# Patient Record
Sex: Female | Born: 1950 | Race: White | Hispanic: No | Smoking: Never smoker
Health system: Southern US, Community
[De-identification: ages and names within clinical notes are randomized; demographics above are authoritative.]

## PROBLEM LIST (undated history)

## (undated) DIAGNOSIS — K5140 Inflammatory polyps of colon without complications: Secondary | ICD-10-CM

## (undated) DIAGNOSIS — G43909 Migraine, unspecified, not intractable, without status migrainosus: Secondary | ICD-10-CM

## (undated) DIAGNOSIS — E785 Hyperlipidemia, unspecified: Secondary | ICD-10-CM

## (undated) DIAGNOSIS — N390 Urinary tract infection, site not specified: Secondary | ICD-10-CM

## (undated) DIAGNOSIS — I639 Cerebral infarction, unspecified: Secondary | ICD-10-CM

## (undated) DIAGNOSIS — K219 Gastro-esophageal reflux disease without esophagitis: Secondary | ICD-10-CM

## (undated) DIAGNOSIS — M1990 Unspecified osteoarthritis, unspecified site: Secondary | ICD-10-CM

## (undated) DIAGNOSIS — J45909 Unspecified asthma, uncomplicated: Secondary | ICD-10-CM

## (undated) DIAGNOSIS — T7840XA Allergy, unspecified, initial encounter: Secondary | ICD-10-CM

## (undated) HISTORY — PX: BILATERAL OOPHORECTOMY: SHX1221

## (undated) HISTORY — DX: Unspecified asthma, uncomplicated: J45.909

## (undated) HISTORY — DX: Hyperlipidemia, unspecified: E78.5

## (undated) HISTORY — DX: Allergy, unspecified, initial encounter: T78.40XA

## (undated) HISTORY — DX: Urinary tract infection, site not specified: N39.0

## (undated) HISTORY — DX: Cerebral infarction, unspecified: I63.9

## (undated) HISTORY — PX: ABDOMINAL HYSTERECTOMY: SHX81

## (undated) HISTORY — DX: Migraine, unspecified, not intractable, without status migrainosus: G43.909

## (undated) HISTORY — DX: Inflammatory polyps of colon without complications: K51.40

## (undated) HISTORY — DX: Unspecified osteoarthritis, unspecified site: M19.90

## (undated) HISTORY — DX: Gastro-esophageal reflux disease without esophagitis: K21.9

---

## 1990-03-20 DIAGNOSIS — I639 Cerebral infarction, unspecified: Secondary | ICD-10-CM

## 1990-03-20 HISTORY — DX: Cerebral infarction, unspecified: I63.9

## 1994-03-20 HISTORY — PX: ABDOMINAL HYSTERECTOMY: SHX81

## 2007-04-22 ENCOUNTER — Ambulatory Visit: Payer: Self-pay | Admitting: Unknown Physician Specialty

## 2007-05-16 LAB — HM COLONOSCOPY

## 2007-08-19 ENCOUNTER — Ambulatory Visit: Payer: Self-pay | Admitting: Unknown Physician Specialty

## 2008-04-19 LAB — HM PAP SMEAR

## 2011-01-17 LAB — HM DEXA SCAN

## 2011-06-18 LAB — HM MAMMOGRAPHY: HM Mammogram: NORMAL

## 2012-04-19 ENCOUNTER — Ambulatory Visit (INDEPENDENT_AMBULATORY_CARE_PROVIDER_SITE_OTHER): Payer: No Typology Code available for payment source | Admitting: Internal Medicine

## 2012-04-19 ENCOUNTER — Encounter: Payer: Self-pay | Admitting: Internal Medicine

## 2012-04-19 VITALS — BP 110/80 | HR 78 | Temp 98.1°F | Resp 16 | Ht 66.0 in | Wt 230.0 lb

## 2012-04-19 DIAGNOSIS — E669 Obesity, unspecified: Secondary | ICD-10-CM

## 2012-04-19 DIAGNOSIS — K219 Gastro-esophageal reflux disease without esophagitis: Secondary | ICD-10-CM

## 2012-04-19 DIAGNOSIS — K625 Hemorrhage of anus and rectum: Secondary | ICD-10-CM

## 2012-04-19 DIAGNOSIS — M1710 Unilateral primary osteoarthritis, unspecified knee: Secondary | ICD-10-CM

## 2012-04-19 DIAGNOSIS — G4733 Obstructive sleep apnea (adult) (pediatric): Secondary | ICD-10-CM

## 2012-04-19 DIAGNOSIS — D126 Benign neoplasm of colon, unspecified: Secondary | ICD-10-CM

## 2012-04-19 NOTE — Progress Notes (Signed)
Patient ID: Donna Richards, female   DOB: 1950/11/09, 62 y.o.   MRN: [id number]   Patient Active Problem List  Diagnosis  . Obesity (BMI 30-39.9)  . GERD (gastroesophageal reflux disease)  . OSA (obstructive sleep apnea)  . DJD (degenerative joint disease) of knee    Subjective:  CC:   Chief Complaint  Patient presents with  . Establish Care    HPI:   Donna Richards is a 62 y.o. female who presents as a new patient to establish primary care with several ongoing medical issues:   GERD:  History of esophagitis ,  hiatal hernia and environmental allergies .  Takes PPI daily (Protonix) since 1996. Prior nexium trial ,protonix is less $$$.  .  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  .no daily use of NSAIDs or tylenol. But had esophageal swelling in the past with unclear etiology so she avoids NSAIDs. Marland Kitchen   History of CVA:  Had a cerebellar CVA at age 68.  Did not seek medical attention until 10 days later.  Presented with sudden onset of imbalance and vertigo.   Workup included MRI, LP ,ECHO and carotid dopplers.  [place] Silos. Was treated with baby aspirin and Inderal 80 mg that lowered her BP  too much and gave her slurred speech, was taken to [facility] and repeated MRI which confirmed cerebellar CVA ,  Medication was stopped .   Obesity.  She started gaining weight during her 30's secondary to OCPS for severe premenstrual dysphoria which included recurrent nausea e mesis and bleeding,  Had a hysterectomy  Wt loss down to 140 pre treatment of esophagitis,  Then gained it all back . Has tried Weight Watchers 3 times,  Did fairly well .   Mild sleep apnea,diagnosed by  Meredeth Ide with a home pulse oximetry test ; she prefers to defer CPAP until she loses weight.   Health screening: she is overdue for colonoscopy.  Was due in 2010 for  history of adenomatous polyps.  Has rectal bleeding frequently despite using Cotton wipes.     Past Medical History  Diagnosis Date  . Asthma   . Arthritis   . GERD (gastroesophageal reflux disease)   . Allergy   . Hyperlipidemia   . Migraines   . Inflammatory polyps of colon   . Stroke 1992  . UTI (urinary tract infection)     Past Surgical History  Procedure Date  . Abdominal hysterectomy 1996    Family History  Problem Relation Age of Onset  . Hyperlipidemia Mother   . Heart disease Mother   . Kidney disease Mother   . Diabetes Mother   . Heart disease Father   . COPD Father   . Diabetes Maternal Grandmother   . Heart disease Maternal Grandfather   . Stroke Paternal Grandmother     History   Social History  . Marital Status: Divorced    Spouse Name: N/A    Number of Children: N/A  . Years of Education: N/A   Occupational History  . Not on file.   Social History Main Topics  . Smoking status: Never Smoker   . Smokeless tobacco: Not on file  . Alcohol Use: No  . Drug Use: No  . Sexually Active:    Other Topics Concern  .  Not on file   Social History Narrative  . No narrative on file   Allergies  Allergen Reactions  . Eggs Or Egg-Derived Products Swelling    Review of Systems:   Patient denies headache, fevers, malaise, unintentional weight loss, skin rash, eye pain, sinus congestion and sinus pain, sore throat, dysphagia,  hemoptysis , cough, dyspnea, wheezing, chest pain, palpitations, orthopnea, edema, abdominal pain, nausea, melena, diarrhea, constipation, flank pain, dysuria, hematuria, urinary  Frequency, nocturia, numbness, tingling, seizures,  Focal weakness, Loss of consciousness,  Tremor, insomnia, depression, anxiety, and suicidal ideation.     Objective:  BP 110/80  Pulse 78  Temp 98.1 F (36.7 C) (Oral)  Resp 16  Ht 5\' 6"  (1.676 m)  Wt 230 lb (104.327 kg)  BMI 37.12 kg/m2  SpO2 99%  General appearance: alert, cooperative  and appears stated age Ears: normal TM's and external ear canals both ears Throat: lips, mucosa, and tongue normal; teeth and gums normal Neck: no adenopathy, no carotid bruit, supple, symmetrical, trachea midline and thyroid not enlarged, symmetric, no tenderness/mass/nodules Back: symmetric, no curvature. ROM normal. No CVA tenderness. Lungs: clear to auscultation bilaterally Heart: regular rate and rhythm, S1, S2 normal, no murmur, click, rub or gallop Abdomen: soft, non-tender; bowel sounds normal; no masses,  no organomegaly Pulses: 2+ and symmetric Skin: Skin color, texture, turgor normal. No rashes or lesions Lymph nodes: Cervical, supraclavicular, and axillary nodes normal.  Assessment and Plan:  OSA (obstructive sleep apnea) diagnosed with prior sleep study but treatment has been deferred d by patient.  Discussed the long term history of OSA , the risks of long term damage to heart and the signs and symptoms attributable to OSA.  Advised patient to consider  significant weight loss and/or use of CPAP.   Obesity (BMI 30-39.9) I have addressed  BMI and recommended a low glycemic index diet utilizing smaller more frequent meals to increase metabolism.  I have also recommended that patient start exercising with a goal of 30 minutes of aerobic exercise a minimum of 5 days per week. Screening for lipid disorders, thyroid and diabetes to be done today.    DJD (degenerative joint disease) of knee Tylenol, aleve prn, wt loss.   Rectal bleeding Patient has history of internal hemorrhoids, polyps and diverticulosis and is overdue by 4 years for colonoscopy  with Dr Mechele Collin per review of [facility] colonoscopy report 2009 54f/u was recommended in one year,  Then every 3) Referral underway    Updated Medication List Outpatient Encounter Prescriptions as of 04/19/2012  Medication Sig Dispense Refill  . CRESTOR 5 MG tablet Take 1 tablet by mouth Every other day.      . fexofenadine (ALLEGRA)  180 MG tablet Take 1 tablet by mouth Daily.      Marland Kitchen FLOVENT HFA 110 MCG/ACT inhaler Take 1 puff by mouth Twice daily.      . fluticasone (FLONASE) 50 MCG/ACT nasal spray Place 1 spray into the nose 2 (two) times daily.      . hydrochlorothiazide (HYDRODIURIL) 25 MG tablet Take 1 tablet by mouth Daily.      . pantoprazole (PROTONIX) 40 MG tablet Take 1 tablet by mouth Daily.      . VENTOLIN HFA 108 (90 BASE) MCG/ACT inhaler Take 2 puffs by mouth Every 4 hours as needed.      . Vitamin D, Ergocalciferol, (DRISDOL) 50000 UNITS CAPS Take 1 tablet by mouth Once a week.

## 2012-04-19 NOTE — Patient Instructions (Addendum)
You need to lose 10%  Of your current body weight over the next 6 months   This is  my version of a  "Low GI"  Diet:  It is not ultra low carb, but will still lower your blood sugars and allow you to lose 5 to 10 lbs per month if you follow it carefully. All of the foods can be found at grocery stores and in bulk at [employer].  The Atkins protein bars and shakes are available in more varieties at [employer], [employer] and [employer].     7 AM Breakfast:  Low carbohydrate Protein  Shakes (I recommend the EAS AdvantEdge "Carb Control" shakes  Or the low carb shakes by Atkins.   Both are available everywhere:  In  cases at [employer]Js  Or in 4 packs at grocery stores and pharmacies  2.5 carbs  (Alternative is  a toasted Arnold's Sandwhich Thin w/ peanut butter, a "Bagel Thin" with cream cheese and salmon) or  a scrambled egg burrito made with a low carb tortilla .  Avoid cereal and bananas, oatmeal too unless you are cooking the old fashioned kind that takes 30-40 minutes to prepare.  the rest is overly processed, has minimal fiber, and is loaded with carbohydrates!   10 AM: Protein bar by Atkins (the snack size, under 200 cal).  There are many varieties , available widely again or in bulk in limited varieties at BJs)  Other so called "protein bars" tend to be loaded with carbohydrates.  Remember, in food advertising, the word "energy" is synonymous for " carbohydrate."  Lunch: sandwich of turkey, (or any lunchmeat, grilled meat or canned tuna), fresh avocado, mayonnaise  and cheese on a lower carbohydrate pita bread, flatbread, or tortilla . Ok to use regular mayonnaise. The bread is the only source or carbohydrate that can be decreased (Joseph's makes a pita bread and a flat bread that are 50 cal and 4 net carbs ; Toufayan makes a low carb flatbread that's 100 cal and 9 net carbs  and  Mission makes a low carb whole wheat tortilla  That is 210 cal and 6 net carbs)  3 PM:  Mid day :  Another protein bar,  Or a  cheese  stick (100 cal, 0 carbs),  Or 1 ounce of  almonds, walnuts, pistachios, pecans, peanuts,  Macadamia nuts. Or a Dannon light n Fit greek yogurt, 80 cal 8 net carbs . Avoid "granola"; the dried cranberries and raisins are loaded with carbohydrates. Mixed nuts ok if no raisins or cranberries or dried fruit.      6 PM  Dinner:  "mean and green:"  Meat/chicken/fish or a high protein legume; , with a green salad, and a low GI  Veggie (broccoli, cauliflower, green beans, spinach, brussel sprouts. Lima beans) : Avoid "Low fat dressings, as well as [place] and [place]! They are loaded with sugar! Instead use ranch, vinagrette,  Blue cheese, etc.  There is a low carb pasta by Dreamfield's available at [employer] that is acceptable and tastes great. Try Michel Angel's chicken piccata over low carb pasta. The chicken dish is 0 carbs, and can be found in frozen section at [employer]Js and [employer]. Also try Aaron Sanchez's "Carnitas" (pulled pork, no sauce,  0 carbs) and his pot roast.   both are in the refrigerated section at [employer]Js   Dreamfield's makes a low carb pasta only 5 g/serving.  Available at all grocery stores,  And tastes like normal   pasta  9 PM snack : Breyer's "low carb" fudgsicle or  ice cream bar (Carb Smart line), or  Weight Watcher's ice cream bar , or another "no sugar added" ice cream;a serving of fresh berries/cherries with whipped cream (Avoid bananas, pineapple, grapes  and watermelon on a regular basis because they are high in sugar)   Remember that snack Substitutions should be less than 10 carbs per serving and meals < 20 carbs. Remember to subtract fiber grams and sugar alcohols to get the "net carbs."  

## 2012-04-21 ENCOUNTER — Encounter: Payer: Self-pay | Admitting: Internal Medicine

## 2012-04-21 DIAGNOSIS — D126 Benign neoplasm of colon, unspecified: Secondary | ICD-10-CM | POA: Insufficient documentation

## 2012-04-21 DIAGNOSIS — K625 Hemorrhage of anus and rectum: Secondary | ICD-10-CM | POA: Insufficient documentation

## 2012-04-21 DIAGNOSIS — K219 Gastro-esophageal reflux disease without esophagitis: Secondary | ICD-10-CM | POA: Insufficient documentation

## 2012-04-21 DIAGNOSIS — E669 Obesity, unspecified: Secondary | ICD-10-CM | POA: Insufficient documentation

## 2012-04-21 DIAGNOSIS — G4733 Obstructive sleep apnea (adult) (pediatric): Secondary | ICD-10-CM | POA: Insufficient documentation

## 2012-04-21 DIAGNOSIS — M1710 Unilateral primary osteoarthritis, unspecified knee: Secondary | ICD-10-CM | POA: Insufficient documentation

## 2012-04-21 NOTE — Assessment & Plan Note (Addendum)
Patient has history of internal hemorrhoids, polyps and diverticulosis and is overdue by 4 years for colonoscopy  with Dr Mechele Collin per review of [facility] colonoscopy report 2009 40f/u was recommended in one year,  Then every 3) Referral underway

## 2012-04-21 NOTE — Assessment & Plan Note (Signed)
I have addressed  BMI and recommended a low glycemic index diet utilizing smaller more frequent meals to increase metabolism.  I have also recommended that patient start exercising with a goal of 30 minutes of aerobic exercise a minimum of 5 days per week. Screening for lipid disorders, thyroid and diabetes to be done today.   

## 2012-04-21 NOTE — Assessment & Plan Note (Signed)
diagnosed with prior sleep study but treatment has been deferred d by patient.  Discussed the long term history of OSA , the risks of long term damage to heart and the signs and symptoms attributable to OSA.  Advised patient to consider  significant weight loss and/or use of CPAP.  

## 2012-04-21 NOTE — Assessment & Plan Note (Signed)
Tylenol, aleve prn, wt loss.

## 2012-06-24 ENCOUNTER — Ambulatory Visit: Payer: Self-pay | Admitting: Unknown Physician Specialty

## 2012-06-26 LAB — PATHOLOGY REPORT

## 2012-06-29 LAB — HM COLONOSCOPY

## 2012-07-18 ENCOUNTER — Other Ambulatory Visit: Payer: Self-pay | Admitting: *Deleted

## 2012-07-18 NOTE — Telephone Encounter (Signed)
HCTZ 25 mg and Vit D2 1.25 mg needs refill [facility] [address].

## 2012-07-19 MED ORDER — FLUTICASONE PROPIONATE 50 MCG/ACT NA SUSP: 1.0000 | Freq: Two times a day (BID) | NASAL | Status: DC

## 2012-07-19 MED ORDER — HYDROCHLOROTHIAZIDE 25 MG PO TABS: 25.0000 mg | ORAL_TABLET | Freq: Every day | ORAL | Status: DC

## 2012-07-19 MED ORDER — VITAMIN D (ERGOCALCIFEROL) 1.25 MG (50000 UNIT) PO CAPS: 50000.0000 [IU] | ORAL_CAPSULE | ORAL | Status: DC

## 2012-07-19 NOTE — Addendum Note (Signed)
Addended by: Sherlene Shams on: 07/19/2012 01:35 PM   Modules accepted: Orders

## 2012-07-19 NOTE — Telephone Encounter (Signed)
Refills done. After this month of Vit d megadose,  Start takign 2000 units daily of OTC  vit d

## 2012-07-19 NOTE — Telephone Encounter (Signed)
Detailed message left

## 2012-07-19 NOTE — Telephone Encounter (Signed)
Is  Okay to refill only seen patient once in [place]

## 2012-07-25 ENCOUNTER — Encounter: Payer: Self-pay | Admitting: Internal Medicine

## 2012-07-26 ENCOUNTER — Encounter: Payer: Self-pay | Admitting: Internal Medicine

## 2012-07-26 ENCOUNTER — Ambulatory Visit (INDEPENDENT_AMBULATORY_CARE_PROVIDER_SITE_OTHER): Payer: No Typology Code available for payment source | Admitting: Internal Medicine

## 2012-07-26 VITALS — BP 128/80 | HR 91 | Temp 97.9°F | Resp 16 | Ht 65.0 in | Wt 223.8 lb

## 2012-07-26 DIAGNOSIS — R7989 Other specified abnormal findings of blood chemistry: Secondary | ICD-10-CM

## 2012-07-26 DIAGNOSIS — G4733 Obstructive sleep apnea (adult) (pediatric): Secondary | ICD-10-CM

## 2012-07-26 DIAGNOSIS — Z0000 Encounter for general adult medical examination without abnormal findings: Secondary | ICD-10-CM

## 2012-07-26 DIAGNOSIS — E669 Obesity, unspecified: Secondary | ICD-10-CM

## 2012-07-26 DIAGNOSIS — Z23 Encounter for immunization: Secondary | ICD-10-CM

## 2012-07-26 DIAGNOSIS — R5381 Other malaise: Secondary | ICD-10-CM

## 2012-07-26 DIAGNOSIS — E785 Hyperlipidemia, unspecified: Secondary | ICD-10-CM

## 2012-07-26 DIAGNOSIS — N289 Disorder of kidney and ureter, unspecified: Secondary | ICD-10-CM

## 2012-07-26 LAB — COMPREHENSIVE METABOLIC PANEL WITH GFR
ALT: 21 U/L (ref 0–35)
AST: 19 U/L (ref 0–37)
Albumin: 4 g/dL (ref 3.5–5.2)
Alkaline Phosphatase: 34 U/L — ABNORMAL LOW (ref 39–117)
BUN: 18 mg/dL (ref 6–23)
CO2: 29 meq/L (ref 19–32)
Calcium: 9.3 mg/dL (ref 8.4–10.5)
Chloride: 100 meq/L (ref 96–112)
Creatinine, Ser: 1.3 mg/dL — ABNORMAL HIGH (ref 0.4–1.2)
GFR: 45.89 mL/min — ABNORMAL LOW (ref >60.00)
Glucose, Bld: 87 mg/dL (ref 70–99)
Potassium: 3.8 meq/L (ref 3.5–5.1)
Sodium: 138 meq/L (ref 135–145)
Total Bilirubin: 1.4 mg/dL — ABNORMAL HIGH (ref 0.3–1.2)
Total Protein: 7.1 g/dL (ref 6.0–8.3)

## 2012-07-26 LAB — CBC WITH DIFFERENTIAL/PLATELET
Basophils Absolute: 0 K/uL (ref 0.0–0.1)
Basophils Relative: 0.8 % (ref 0.0–3.0)
Eosinophils Absolute: 0.1 K/uL (ref 0.0–0.7)
Eosinophils Relative: 1.1 % (ref 0.0–5.0)
HCT: 38.4 % (ref 36.0–46.0)
Hemoglobin: 12.8 g/dL (ref 12.0–15.0)
Lymphocytes Relative: 25.6 % (ref 12.0–46.0)
Lymphs Abs: 1.3 K/uL (ref 0.7–4.0)
MCHC: 33.3 g/dL (ref 30.0–36.0)
MCV: 81 fl (ref 78.0–100.0)
Monocytes Absolute: 0.6 K/uL (ref 0.1–1.0)
Monocytes Relative: 11.8 % (ref 3.0–12.0)
Neutro Abs: 3 K/uL (ref 1.4–7.7)
Neutrophils Relative %: 60.7 % (ref 43.0–77.0)
Platelets: 244 K/uL (ref 150.0–400.0)
RBC: 4.74 Mil/uL (ref 3.87–5.11)
RDW: 14.2 % (ref 11.5–14.6)
WBC: 4.9 K/uL (ref 4.5–10.5)

## 2012-07-26 LAB — LIPID PANEL
LDL Cholesterol: 81 mg/dL (ref 0–99)
Total CHOL/HDL Ratio: 3
Triglycerides: 92 mg/dL (ref 0.0–149.0)
VLDL: 18.4 mg/dL (ref 0.0–40.0)

## 2012-07-26 NOTE — Patient Instructions (Addendum)
We will  E mail you the results of your labs today  You may finish the mega dose of Vitamin D and then start an OTC D3 supplement ( I recommend getting 1000 units daily ,  No more than 2000 units daily )  TDaP vaccine given today

## 2012-07-26 NOTE — Progress Notes (Signed)
Patient ID: Donna Richards, female   DOB: 1951-01-04, 62 y.o.   MRN: [id number]   Subjective:     Donna Richards is a 61 y.o. female and is here for a comprehensive physical exam. The patient reports no problems.  History   Social History  . Marital Status: Divorced    Spouse Name: N/A    Number of Children: N/A  . Years of Education: N/A   Occupational History  . Not on file.   Social History Main Topics  . Smoking status: Never Smoker   . Smokeless tobacco: Not on file  . Alcohol Use: No  . Drug Use: No  . Sexually Active:    Other Topics Concern  . Not on file   Social History Narrative  . No narrative on file   Health Maintenance  Topic Date Due  . Pap Smear  04/20/2011  . Zostavax  08/11/2011  . Influenza Vaccine  11/18/2012  . Mammogram  06/17/2013  . Colonoscopy  06/25/2022  . Tetanus/tdap  07/27/2022    The following portions of the patient's history were reviewed and updated as appropriate: allergies, current medications, past family history, past medical history, past social history, past surgical history and problem list.  Review of Systems A comprehensive review of systems was negative.   Objective:  BP 128/80  Pulse 91  Temp(Src) 97.9 F (36.6 C) (Oral)  Resp 16  Ht 5\' 5"  (1.651 m)  Wt 223 lb 12 oz (101.492 kg)  BMI 37.23 kg/m2  SpO2 98%  General Appearance:    Alert, cooperative, no distress, appears stated age  Head:    Normocephalic, without obvious abnormality, atraumatic  Eyes:    PERRL, conjunctiva/corneas clear, EOM's intact, fundi    benign, both eyes  Ears:    Normal TM's and external ear canals, both ears  Nose:   Nares normal, septum midline, mucosa normal, no drainage    or sinus tenderness  Throat:   Lips, mucosa, and tongue normal; teeth and gums normal  Neck:   Supple, symmetrical, trachea midline, no adenopathy;    thyroid:  no enlargement/tenderness/nodules; no carotid   bruit or JVD  Back:     Symmetric, no curvature, ROM  normal, no CVA tenderness  Lungs:     Clear to auscultation bilaterally, respirations unlabored  Chest Wall:    No tenderness or deformity   Heart:    Regular rate and rhythm, S1 and S2 normal, no murmur, rub   or gallop  Breast Exam:    No tenderness, masses, or nipple abnormality  Abdomen:     Soft, non-tender, bowel sounds active all four quadrants,    no masses, no organomegaly  Genitalia:    Pelvic: cervix and uterus surgically absent   rectovaginal septum normal, uterus normal size, shape, and consistency and vagina normal without discharge  Extremities:   Extremities normal, atraumatic, no cyanosis or edema  Pulses:   2+ and symmetric all extremities  Skin:   Skin color, texture, turgor normal, no rashes or lesions  Lymph nodes:   Cervical, supraclavicular, and axillary nodes normal  Neurologic:   CNII-XII intact, normal strength, sensation and reflexes    throughout    Assessment:   Routine general medical examination at a health care facility Annual comprehensive exam was done including breast and pelvic exam .  She is s/p TAH/BSO. All screenings have been addressed .   OSA (obstructive sleep apnea) diagnosed by prior sleep study but treatment has been deferred  d by patient.  We discussed the long term history of OSA , the risks of long term damage to heart and the signs and symptoms attributable to OSA.  She prefers to readdress once she has lost10% of her body weight.   Obesity (BMI 30-39.9) Improving with lower GI diet.  7 lbs in 2 months.. Encouragement given,  adding exercise  Renal insufficiency, mild Fasting cr was 1.3 ,  No priro for comparison.  She will return in a month flor a nonfasting check.    Updated Medication List Outpatient Encounter Prescriptions as of 07/26/2012  Medication Sig Dispense Refill  . CRESTOR 5 MG tablet Take 1 tablet by mouth Every other day.      . fexofenadine (ALLEGRA) 180 MG tablet Take 1 tablet by mouth Daily.      Marland Kitchen FLOVENT HFA 110  MCG/ACT inhaler Take 1 puff by mouth Twice daily.      . fluticasone (FLONASE) 50 MCG/ACT nasal spray Place 1 spray into the nose 2 (two) times daily.  16 g  5  . hydrochlorothiazide (HYDRODIURIL) 25 MG tablet Take 1 tablet (25 mg total) by mouth daily.  90 tablet  1  . pantoprazole (PROTONIX) 40 MG tablet Take 1 tablet by mouth Daily.      . VENTOLIN HFA 108 (90 BASE) MCG/ACT inhaler Take 2 puffs by mouth Every 4 hours as needed.      . Vitamin D, Ergocalciferol, (DRISDOL) 50000 UNITS CAPS Take 1 capsule (50,000 Units total) by mouth every 7 (seven) days.  30 capsule  0   No facility-administered encounter medications on file as of 07/26/2012.

## 2012-07-28 ENCOUNTER — Encounter: Payer: Self-pay | Admitting: Internal Medicine

## 2012-07-28 DIAGNOSIS — N289 Disorder of kidney and ureter, unspecified: Secondary | ICD-10-CM | POA: Insufficient documentation

## 2012-07-28 DIAGNOSIS — Z0000 Encounter for general adult medical examination without abnormal findings: Secondary | ICD-10-CM | POA: Insufficient documentation

## 2012-07-28 NOTE — Assessment & Plan Note (Addendum)
Annual comprehensive exam was done including breast and pelvic exam .  She is s/p TAH/BSO. All screenings have been addressed .

## 2012-07-28 NOTE — Assessment & Plan Note (Signed)
Fasting cr was 1.3 ,  No priro for comparison.  She will return in a month flor a nonfasting check.

## 2012-07-28 NOTE — Assessment & Plan Note (Signed)
Improving with lower GI diet.  7 lbs in 2 months.. Encouragement given,  adding exercise

## 2012-07-28 NOTE — Assessment & Plan Note (Signed)
diagnosed by prior sleep study but treatment has been deferred d by patient.  We discussed the long term history of OSA , the risks of long term damage to heart and the signs and symptoms attributable to OSA.  She prefers to readdress once she has lost10% of her body weight.

## 2012-08-28 ENCOUNTER — Telehealth: Payer: Self-pay | Admitting: *Deleted

## 2012-08-28 MED ORDER — ROSUVASTATIN CALCIUM 5 MG PO TABS: 5.0000 mg | ORAL_TABLET | Freq: Every day | ORAL | Status: DC

## 2012-08-28 MED ORDER — PANTOPRAZOLE SODIUM 40 MG PO TBEC: 40.0000 mg | DELAYED_RELEASE_TABLET | Freq: Every day | ORAL | Status: DC

## 2012-08-28 MED ORDER — FEXOFENADINE HCL 180 MG PO TABS: 180.0000 mg | ORAL_TABLET | Freq: Every day | ORAL | Status: DC

## 2012-08-28 NOTE — Telephone Encounter (Signed)
Medications refilled per protocol.

## 2012-08-29 ENCOUNTER — Other Ambulatory Visit (INDEPENDENT_AMBULATORY_CARE_PROVIDER_SITE_OTHER): Payer: No Typology Code available for payment source

## 2012-08-29 DIAGNOSIS — R7989 Other specified abnormal findings of blood chemistry: Secondary | ICD-10-CM

## 2012-08-29 LAB — COMPREHENSIVE METABOLIC PANEL
ALT: 21 U/L (ref 0–35)
AST: 19 U/L (ref 0–37)
Alkaline Phosphatase: 32 U/L — ABNORMAL LOW (ref 39–117)
Calcium: 9.2 mg/dL (ref 8.4–10.5)
Chloride: 103 mEq/L (ref 96–112)
Creatinine, Ser: 0.9 mg/dL (ref 0.4–1.2)
Total Bilirubin: 0.9 mg/dL (ref 0.3–1.2)

## 2012-08-30 ENCOUNTER — Encounter: Payer: Self-pay | Admitting: Internal Medicine

## 2013-01-04 ENCOUNTER — Other Ambulatory Visit: Payer: Self-pay | Admitting: Internal Medicine

## 2013-01-19 ENCOUNTER — Other Ambulatory Visit: Payer: Self-pay | Admitting: Internal Medicine

## 2013-01-23 ENCOUNTER — Other Ambulatory Visit: Payer: Self-pay

## 2013-02-19 ENCOUNTER — Other Ambulatory Visit: Payer: Self-pay | Admitting: Internal Medicine

## 2013-02-27 ENCOUNTER — Other Ambulatory Visit: Payer: Self-pay | Admitting: Internal Medicine

## 2013-03-24 ENCOUNTER — Telehealth: Payer: Self-pay | Admitting: Internal Medicine

## 2013-03-24 NOTE — Telephone Encounter (Signed)
Pt states she does not need any refills at this time but is asking if she has to be seen before she can get her refills.  Appt scheduled for CPE 07/2013.

## 2013-03-26 NOTE — Telephone Encounter (Signed)
Patient would like to know if she needs an OV for any refills ? Last fasting labs 07/2012

## 2013-07-21 ENCOUNTER — Other Ambulatory Visit: Payer: Self-pay | Admitting: Internal Medicine

## 2013-07-21 NOTE — Telephone Encounter (Signed)
Appt 07/28/13

## 2013-07-28 ENCOUNTER — Encounter: Payer: Self-pay | Admitting: Internal Medicine

## 2013-07-28 ENCOUNTER — Ambulatory Visit (INDEPENDENT_AMBULATORY_CARE_PROVIDER_SITE_OTHER): Payer: No Typology Code available for payment source | Admitting: Internal Medicine

## 2013-07-28 VITALS — BP 120/76 | HR 84 | Temp 97.8°F | Resp 16 | Ht 66.25 in | Wt 226.5 lb

## 2013-07-28 DIAGNOSIS — G4733 Obstructive sleep apnea (adult) (pediatric): Secondary | ICD-10-CM

## 2013-07-28 DIAGNOSIS — Z23 Encounter for immunization: Secondary | ICD-10-CM

## 2013-07-28 DIAGNOSIS — R5383 Other fatigue: Secondary | ICD-10-CM

## 2013-07-28 DIAGNOSIS — D126 Benign neoplasm of colon, unspecified: Secondary | ICD-10-CM

## 2013-07-28 DIAGNOSIS — Z1239 Encounter for other screening for malignant neoplasm of breast: Secondary | ICD-10-CM

## 2013-07-28 DIAGNOSIS — E669 Obesity, unspecified: Secondary | ICD-10-CM

## 2013-07-28 DIAGNOSIS — E785 Hyperlipidemia, unspecified: Secondary | ICD-10-CM

## 2013-07-28 DIAGNOSIS — R5381 Other malaise: Secondary | ICD-10-CM

## 2013-07-28 DIAGNOSIS — E559 Vitamin D deficiency, unspecified: Secondary | ICD-10-CM

## 2013-07-28 DIAGNOSIS — Z0000 Encounter for general adult medical examination without abnormal findings: Secondary | ICD-10-CM

## 2013-07-28 LAB — TSH: TSH: 1.51 u[IU]/mL (ref 0.35–4.50)

## 2013-07-28 LAB — LIPID PANEL
Cholesterol: 147 mg/dL (ref 0–200)
HDL: 63.3 mg/dL (ref >39.00)
LDL CALC: 69 mg/dL (ref 0–99)
Total CHOL/HDL Ratio: 2
Triglycerides: 75 mg/dL (ref 0.0–149.0)
VLDL: 15 mg/dL (ref 0.0–40.0)

## 2013-07-28 LAB — COMPREHENSIVE METABOLIC PANEL
ALK PHOS: 32 U/L — AB (ref 39–117)
ALT: 19 U/L (ref 0–35)
AST: 19 U/L (ref 0–37)
Albumin: 3.8 g/dL (ref 3.5–5.2)
BILIRUBIN TOTAL: 0.9 mg/dL (ref 0.2–1.2)
BUN: 12 mg/dL (ref 6–23)
CO2: 31 meq/L (ref 19–32)
CREATININE: 0.9 mg/dL (ref 0.4–1.2)
Calcium: 9.4 mg/dL (ref 8.4–10.5)
Chloride: 99 mEq/L (ref 96–112)
GFR: 70.13 mL/min (ref >60.00)
GLUCOSE: 89 mg/dL (ref 70–99)
Potassium: 3.3 mEq/L — ABNORMAL LOW (ref 3.5–5.1)
Sodium: 139 mEq/L (ref 135–145)
Total Protein: 7.3 g/dL (ref 6.0–8.3)

## 2013-07-28 LAB — CBC WITH DIFFERENTIAL/PLATELET
BASOS ABS: 0 10*3/uL (ref 0.0–0.1)
BASOS PCT: 1.1 % (ref 0.0–3.0)
EOS PCT: 3.7 % (ref 0.0–5.0)
Eosinophils Absolute: 0.2 10*3/uL (ref 0.0–0.7)
HCT: 36.6 % (ref 36.0–46.0)
HEMOGLOBIN: 12 g/dL (ref 12.0–15.0)
LYMPHS PCT: 25 % (ref 12.0–46.0)
Lymphs Abs: 1 10*3/uL (ref 0.7–4.0)
MCHC: 32.7 g/dL (ref 30.0–36.0)
MCV: 83.5 fl (ref 78.0–100.0)
Monocytes Absolute: 0.5 10*3/uL (ref 0.1–1.0)
Monocytes Relative: 12.9 % — ABNORMAL HIGH (ref 3.0–12.0)
NEUTROS ABS: 2.4 10*3/uL (ref 1.4–7.7)
Neutrophils Relative %: 57.3 % (ref 43.0–77.0)
Platelets: 230 10*3/uL (ref 150.0–400.0)
RBC: 4.38 Mil/uL (ref 3.87–5.11)
RDW: 14.7 % (ref 11.5–15.5)
WBC: 4.1 10*3/uL (ref 4.0–10.5)

## 2013-07-28 NOTE — Progress Notes (Signed)
Patient ID: Donna Richards, female   DOB: [birth year], 63 y.o.   MRN: [id number]  Subjective:     Donna Richards is a 63 y.o. female and is here for a comprehensive physical exam. The patient reports problems - with weight gain and abdominal pain .  History   Social History  . Marital Status: Divorced    Spouse Name: N/A    Number of Children: N/A  . Years of Education: N/A   Occupational History  . Not on file.   Social History Main Topics  . Smoking status: Never Smoker   . Smokeless tobacco: Not on file  . Alcohol Use: No  . Drug Use: No  . Sexual Activity:    Other Topics Concern  . Not on file   Social History Narrative  . No narrative on file   Health Maintenance  Topic Date Due  . Pap Smear  04/20/2011  . Mammogram  06/17/2013  . Influenza Vaccine  10/18/2013  . Colonoscopy  06/25/2022  . Tetanus/tdap  07/27/2022  . Zostavax  Completed    The following portions of the patient's history were reviewed and updated as appropriate: allergies, current medications, past medical history, past social history, past surgical history and problem list.  Review of Systems A comprehensive review of systems was negative.   Objective:   BP 120/76  Pulse 84  Temp(Src) 97.8 F (36.6 C) (Oral)  Resp 16  Ht 5' 6.25" (1.683 m)  Wt 226 lb 8 oz (102.74 kg)  BMI 36.27 kg/m2  SpO2 99%  General appearance: alert, cooperative and appears stated age Head: Normocephalic, without obvious abnormality, atraumatic Eyes: conjunctivae/corneas clear. PERRL, EOM's intact. Fundi benign. Ears: normal TM's and external ear canals both ears Nose: Nares normal. Septum midline. Mucosa normal. No drainage or sinus tenderness. Throat: lips, mucosa, and tongue normal; teeth and gums normal Neck: no adenopathy, no carotid bruit, no JVD, supple, symmetrical, trachea midline and thyroid not enlarged, symmetric, no tenderness/mass/nodules Lungs: clear to auscultation bilaterally Breasts: normal  appearance, no masses or tenderness Heart: regular rate and rhythm, S1, S2 normal, no murmur, click, rub or gallop Abdomen: soft, non-tender; bowel sounds normal; no masses,  no organomegaly Extremities: extremities normal, atraumatic, no cyanosis or edema Pulses: 2+ and symmetric Skin: Skin color, texture, turgor normal. No rashes or lesions Neurologic: Alert and oriented X 3, normal strength and tone. Normal symmetric reflexes. Normal coordination and gait.   Assessment and Plan:   Obesity (BMI 30-39.9) I have addressed  BMI and recommended wt loss of 10% of body weigh over the next 6 months using a low glycemic index diet and regular exercise a minimum of 5 days per week.    OSA (obstructive sleep apnea) Diagnosed by home sleep study ordered by Dr Raul Del . She is wearing her CPAP every night a minimum of 6 hours per night.   Adenomatous colon polyp She had follow up colonoscopy with Dr Coralyn Helling 2014.  Nest one due April 2019  Routine general medical examination at a health care facility Annual comprehensive exam was done including breast, excluding pelvic and PAP smear.  She is s/p TAH/BSO All screenings have been addressed and updated. .   Other and unspecified hyperlipidemia Managed with Crestor. LDL and triglycerides are at goal on current medications. She has no side effects and liver enzymes are normal. No changes today   Lab Results  Component Value Date   CHOL 147 07/28/2013   HDL 63.30 07/28/2013   LDLCALC 69  07/28/2013   TRIG 75.0 07/28/2013   CHOLHDL 2 07/28/2013   Lab Results  Component Value Date   ALT 19 07/28/2013   AST 19 07/28/2013   ALKPHOS 32* 07/28/2013   BILITOT 0.9 07/28/2013      Updated Medication List Outpatient Encounter Prescriptions as of 07/28/2013  Medication Sig  . FLOVENT HFA 110 MCG/ACT inhaler inhale 1 puff by mouth twice a day  . fluticasone (FLONASE) 50 MCG/ACT nasal spray instill 1 spray into each nostril twice a day  .  hydrochlorothiazide (HYDRODIURIL) 25 MG tablet take 1 tablet by mouth once daily  . pantoprazole (PROTONIX) 40 MG tablet take 1 tablet by mouth once daily  . RA ALLERGY RELIEF 180 MG tablet take 1 tablet by mouth once daily  . rosuvastatin (CRESTOR) 5 MG tablet Take 1 tablet (5 mg total) by mouth daily.  . VENTOLIN HFA 108 (90 BASE) MCG/ACT inhaler Take 2 puffs by mouth Every 4 hours as needed.  . Vitamin D, Ergocalciferol, (DRISDOL) 50000 UNITS CAPS Take 1 capsule (50,000 Units total) by mouth every 7 (seven) days.

## 2013-07-28 NOTE — Progress Notes (Signed)
Pre-visit discussion using our clinic review tool. No additional management support is needed unless otherwise documented below in the visit note.  

## 2013-07-28 NOTE — Patient Instructions (Signed)
You had your annual  wellness exam today.     We will schedule your mammogram at [facility] received the Shingles vaccine today.  If you develop a sore arm , use tylenol and ibuprofen for a few days   We will contact you with the bloodwork results  I want to support you in your goal to lose weight.  I have attached a copy of my diet for suggestions  This is just one version   (my version)  of a  "Low GI"  Diet:  It will still lower your blood sugars and allow you to lose 4 to 8  lbs  per month if you follow it carefully.  Your goal with exercise is a minimum of 30 minutes of aerobic exercise 5 days per week (Walking does not count once it becomes easy!)   If you have not lost 12 lbs in 3 months,  Call me to discuss adding an appetite suppressant (phentermine)   All of the foods can be found at grocery stores and in bulk at [employer].  The Atkins protein bars and shakes are available in more varieties at Target, [employer] and [employer].     7 AM Breakfast:  Choose from the following:  Low carbohydrate Protein  Shakes (I recommend the EAS AdvantEdge "Carb Control" shakes  Or the low carb shakes by Atkins.    2.5 carbs   Arnold's "Sandwhich Thin"toasted  w/ peanut butter (no jelly: about 20 net carbs  "Bagel Thin" with cream cheese and salmon: about 20 carbs   a scrambled egg/bacon/cheese burrito made with Mission's "carb balance" whole wheat tortilla  (about 10 net carbs )   Avoid cereal and bananas, oatmeal and cream of wheat and grits. They are loaded with carbohydrates!   10 AM: high protein snack  Protein bar by Atkins (the snack size, under 200 cal, usually < 6 net carbs).    A stick of cheese:  Around 1 carb,  100 cal     Dannon Light n Fit [place] Yogurt  (80 cal, 8 carbs)  Other so called "protein bars" and Greek yogurts tend to be loaded with carbohydrates.  Remember, in food advertising, the word "energy" is synonymous for " carbohydrate."  Lunch:   A Sandwich using the  bread choices listed, Can use any  Eggs,  lunchmeat, grilled meat or canned tuna), avocado, regular mayo/mustard  and cheese.  A Salad using blue cheese, ranch,  Goddess or vinagrette,  No croutons or "confetti" and no "candied nuts" but regular nuts OK.   No pretzels or chips.  Pickles and miniature sweet peppers are a good low carb alternative that provide a "crunch"  The bread is the only source of carbohydrate in a sandwich and  can be decreased by trying some of these alternatives to traditional loaf bread  Joseph's makes a pita bread and a flat bread that are 50 cal and 4 net carbs available at [place] and [place].  This can be toasted to use with hummous as well  Toufayan makes a low carb flatbread that's 100 cal and 9 net carbs available at [employer] and [employer]J's makes 2 sizes of  Low carb whole wheat tortilla  (The large one is 210 cal and 6 net carbs) Avoid "Low fat dressings, as well as [place] dressings They are loaded with sugar!   3 PM/ Mid day  Snack:  Consider  1 ounce of  almonds, walnuts, pistachios, pecans, peanuts,  Macadamia nuts or a nut medley.  Avoid "granola"; the dried cranberries and raisins are loaded with carbohydrates. Mixed nuts as long as there are no raisins,  cranberries or dried fruit.    Try the prosciutto/mozzarella cheese sticks by Fiorruci  In deli /backery section   High protein      6 PM  Dinner:     Meat/fowl/fish with a green salad, and either broccoli, cauliflower, green beans, spinach, brussel sprouts or  Lima beans. DO NOT BREAD THE PROTEIN!!      There is a low carb pasta by Dreamfield's that is acceptable and tastes great: only 5 digestible carbs/serving.( All grocery stores but BJs carry it )  Try Hurley Cisco Angelo's chicken piccata or chicken or eggplant parm over low carb pasta.(Lowes and BJs)   Marjory Lies Sanchez's "Carnitas" (pulled pork, no sauce,  0 carbs) or his beef pot roast to make a dinner burrito (at BJ's)  Pesto over low  carb pasta (bj's sells a good quality pesto in the center refrigerated section of the deli   Try satueeing  Cheral Marker with mushroooms  Whole wheat pasta is still full of digestible carbs and  Not as low in glycemic index as Dreamfield's.   Brown rice is still rice,  So skip the rice and noodles if you eat [place] or [place] (or at least limit to 1/2 cup)  9 PM snack :   Breyer's "low carb" fudgsicle or  ice cream bar (Carb Smart line), or  Weight Watcher's ice cream bar , or another "no sugar added" ice cream;  a serving of fresh berries/cherries with whipped cream   Cheese or DANNON'S LlGHT N FIT [place] YOGURT  8 ounces of [place] unsweetened almond/cococunut milk    Avoid bananas, pineapple, grapes  and watermelon on a regular basis because they are high in sugar.  THINK OF THEM AS DESSERT  Remember that snack Substitutions should be less than 10 NET carbs per serving and meals < 20 carbs. Remember to subtract fiber grams to get the "net carbs."

## 2013-07-29 DIAGNOSIS — E785 Hyperlipidemia, unspecified: Secondary | ICD-10-CM | POA: Insufficient documentation

## 2013-07-29 LAB — VITAMIN D 25 HYDROXY (VIT D DEFICIENCY, FRACTURES): VIT D 25 HYDROXY: 20 ng/mL — AB (ref 30–89)

## 2013-07-29 NOTE — Assessment & Plan Note (Signed)
Annual comprehensive exam was done including breast, excluding pelvic and PAP smear.  She is s/p TAH/BSO All screenings have been addressed and updated. Marland Kitchen

## 2013-07-29 NOTE — Assessment & Plan Note (Signed)
I have addressed  BMI and recommended wt loss of 10% of body weigh over the next 6 months using a low glycemic index diet and regular exercise a minimum of 5 days per week.   

## 2013-07-29 NOTE — Assessment & Plan Note (Addendum)
She had follow up colonoscopy with Dr Coralyn Helling 2014.  Nest one due April 2019

## 2013-07-29 NOTE — Assessment & Plan Note (Signed)
Diagnosed by home sleep study ordered by Dr Raul Del . She is wearing her CPAP every night a minimum of 6 hours per night.

## 2013-07-29 NOTE — Assessment & Plan Note (Signed)
Managed with Crestor. LDL and triglycerides are at goal on current medications. She has no side effects and liver enzymes are normal. No changes today   Lab Results  Component Value Date   CHOL 147 07/28/2013   HDL 63.30 07/28/2013   LDLCALC 69 07/28/2013   TRIG 75.0 07/28/2013   CHOLHDL 2 07/28/2013   Lab Results  Component Value Date   ALT 19 07/28/2013   AST 19 07/28/2013   ALKPHOS 32* 07/28/2013   BILITOT 0.9 07/28/2013

## 2013-07-30 ENCOUNTER — Encounter: Payer: Self-pay | Admitting: Internal Medicine

## 2013-07-30 MED ORDER — POTASSIUM CHLORIDE CRYS ER 20 MEQ PO TBCR: 20.0000 meq | EXTENDED_RELEASE_TABLET | Freq: Every day | ORAL | Status: DC

## 2013-07-30 MED ORDER — ERGOCALCIFEROL 1.25 MG (50000 UT) PO CAPS
50000.0000 [IU] | ORAL_CAPSULE | ORAL | Status: DC
Start: 2013-07-30 — End: 2014-08-15

## 2013-07-30 NOTE — Addendum Note (Signed)
Addended by: Crecencio Mc on: 07/30/2013 01:50 PM   Modules accepted: Orders

## 2013-08-08 ENCOUNTER — Encounter: Payer: Self-pay | Admitting: *Deleted

## 2013-08-20 ENCOUNTER — Other Ambulatory Visit: Payer: Self-pay | Admitting: Internal Medicine

## 2013-08-26 ENCOUNTER — Telehealth: Payer: Self-pay | Admitting: Internal Medicine

## 2013-08-26 DIAGNOSIS — R928 Other abnormal and inconclusive findings on diagnostic imaging of breast: Secondary | ICD-10-CM

## 2013-08-26 NOTE — Telephone Encounter (Signed)
Left message top call office.

## 2013-08-26 NOTE — Telephone Encounter (Signed)
Her mammogram was abnormal on the left.  They saw an asymmetry and want additional images, If they have not contacted her by end of the week let me know

## 2013-08-28 NOTE — Telephone Encounter (Signed)
Left message to call office

## 2013-08-31 ENCOUNTER — Other Ambulatory Visit: Payer: Self-pay | Admitting: Internal Medicine

## 2013-09-02 NOTE — Telephone Encounter (Signed)
Patient aware.

## 2013-09-12 LAB — HM MAMMOGRAPHY: HM Mammogram: NORMAL

## 2013-09-16 ENCOUNTER — Encounter: Payer: Self-pay | Admitting: Internal Medicine

## 2013-10-01 ENCOUNTER — Other Ambulatory Visit: Payer: Self-pay | Admitting: *Deleted

## 2013-10-01 MED ORDER — FLUTICASONE PROPIONATE 50 MCG/ACT NA SUSP: NASAL | Status: DC

## 2013-12-21 ENCOUNTER — Other Ambulatory Visit: Payer: Self-pay | Admitting: Internal Medicine

## 2014-02-19 ENCOUNTER — Other Ambulatory Visit: Payer: Self-pay | Admitting: Internal Medicine

## 2014-02-27 ENCOUNTER — Other Ambulatory Visit: Payer: Self-pay | Admitting: Internal Medicine

## 2014-05-13 ENCOUNTER — Encounter: Payer: Self-pay | Admitting: Internal Medicine

## 2014-05-13 ENCOUNTER — Ambulatory Visit (INDEPENDENT_AMBULATORY_CARE_PROVIDER_SITE_OTHER): Payer: No Typology Code available for payment source | Admitting: Internal Medicine

## 2014-05-13 VITALS — BP 104/68 | HR 81 | Temp 98.4°F | Resp 16 | Ht 66.25 in | Wt 221.2 lb

## 2014-05-13 DIAGNOSIS — J0100 Acute maxillary sinusitis, unspecified: Secondary | ICD-10-CM

## 2014-05-13 DIAGNOSIS — Z90722 Acquired absence of ovaries, bilateral: Secondary | ICD-10-CM

## 2014-05-13 DIAGNOSIS — Z86010 Personal history of colonic polyps: Secondary | ICD-10-CM

## 2014-05-13 DIAGNOSIS — Z90710 Acquired absence of both cervix and uterus: Secondary | ICD-10-CM

## 2014-05-13 DIAGNOSIS — Z9079 Acquired absence of other genital organ(s): Secondary | ICD-10-CM

## 2014-05-13 MED ORDER — PREDNISONE 10 MG PO TABS: ORAL_TABLET | ORAL | Status: DC

## 2014-05-13 MED ORDER — AMOXICILLIN-POT CLAVULANATE 875-125 MG PO TABS: 1.0000 | ORAL_TABLET | Freq: Two times a day (BID) | ORAL | Status: DC

## 2014-05-13 NOTE — Patient Instructions (Addendum)
I am treating you for sinusitis/otitisi media   I am prescribing an antibiotic (augmentin) and a prednisone taper  To manage the infection and the inflammation in your ear/sinuses.   I also advise use of the following OTC meds to help with your other symptoms.   Take generic OTC benadryl 25 mg  Every 8 hours for the drainage   flush your sinuses twice daily with Milta Deiters med Sinus rinse .  do over the sink because if you do it right you will spit out globs of mucus)   Gargle with salt water as needed for sore throat.    Please take a probiotic ( Align, Floraque or Culturelle) for at least 3 weeks  if you start the antibiotic to prevent a serious antibiotic associated diarrhea  Called" clostridium dificile colitis" ( should also help prevent   vaginal yeast infection)

## 2014-05-13 NOTE — Progress Notes (Signed)
Pre-visit discussion using our clinic review tool. No additional management support is needed unless otherwise documented below in the visit note.  

## 2014-05-13 NOTE — Progress Notes (Signed)
Patient ID: Donna Richards, female   DOB: 03-02-1951, 63 y.o.   MRN: [id number]  Patient Active Problem List   Diagnosis Date Noted  . Sinusitis, acute maxillary 05/15/2014  . Abnormal mammogram 08/26/2013  . Other and unspecified hyperlipidemia 07/29/2013  . Routine general medical examination at a health care facility 07/28/2012  . Renal insufficiency, mild 07/28/2012  . Obesity (BMI 30-39.9) 04/21/2012  . GERD (gastroesophageal reflux disease) 04/21/2012  . OSA (obstructive sleep apnea) 04/21/2012  . DJD (degenerative joint disease) of knee 04/21/2012  . Adenomatous colon polyp 04/21/2012    Subjective:  CC:   Chief Complaint  Patient presents with  . Otalgia    Bilateral , runny nose,     HPI:   Donna Richards is a 65 y.o. female who presents for  Bilateral ear pain  and left maxillary sinus pain lasted 5 days , since Saturday.  Has been taking mucinex D regularly since symptoms of sinus congestion and cough began with no significant change.  Sinus drainage is clear. Positive sick contacts.  Sister hs been hospitalized ,  Is now home with Hospice care for end stage endometrial CA.     Past Medical History  Diagnosis Date  . Asthma   . Arthritis   . GERD (gastroesophageal reflux disease)   . Allergy   . Hyperlipidemia   . Migraines   . Inflammatory polyps of colon   . Stroke 1992  . UTI (urinary tract infection)     Past Surgical History  Procedure Laterality Date  . Bilateral oophorectomy      endometriosis  . Abdominal hysterectomy  1996    endometriosis       The following portions of the patient's history were reviewed and updated as appropriate: Allergies, current medications, and problem list.    Review of Systems:   Patient denies headache, fevers, malaise, unintentional weight loss, skin rash, eye pain, sinus congestion and sinus pain, sore throat, dysphagia,  hemoptysis , cough, dyspnea, wheezing, chest pain, palpitations, orthopnea, edema,  abdominal pain, nausea, melena, diarrhea, constipation, flank pain, dysuria, hematuria, urinary  Frequency, nocturia, numbness, tingling, seizures,  Focal weakness, Loss of consciousness,  Tremor, insomnia, depression, anxiety, and suicidal ideation.     History   Social History  . Marital Status: Divorced    Spouse Name: N/A  . Number of Children: N/A  . Years of Education: N/A   Occupational History  . Not on file.   Social History Main Topics  . Smoking status: Never Smoker   . Smokeless tobacco: Not on file  . Alcohol Use: No  . Drug Use: No  . Sexual Activity: Not on file   Other Topics Concern  . Not on file   Social History Narrative    Objective:  Filed Vitals:   05/13/14 1333  BP: 104/68  Pulse: 81  Temp: 98.4 F (36.9 C)  Resp: 16     General appearance: alert, cooperative and appears stated age Ears: TMs are erythematous bilaterally, no effusions,  HENT: maxillary sinus tenderness on right : lips, mucosa, and tongue normal; teeth and gums normal Neck: no adenopathy, no carotid bruit, supple, symmetrical, trachea midline and thyroid not enlarged, symmetric, no tenderness/mass/nodules Back: symmetric, no curvature. ROM normal. No CVA tenderness. Lungs: clear to auscultation bilaterally Heart: regular rate and rhythm, S1, S2 normal, no murmur, click, rub or gallop Abdomen: soft, non-tender; bowel sounds normal; no masses,  no organomegaly Pulses: 2+ and symmetric Skin: Skin color, texture,  turgor normal. No rashes or lesions Lymph nodes: Cervical, supraclavicular, and axillary nodes normal.  Assessment and Plan:  Sinusitis, acute maxillary With signs of bilateral otitis.  Augmentin,  prednisone taper, sinus rinses, probiotic.     Updated Medication List Outpatient Encounter Prescriptions as of 05/13/2014  Medication Sig  . CRESTOR 5 MG tablet take 1 tablet by mouth once daily  . FLOVENT HFA 110 MCG/ACT inhaler inhale 1 puff by mouth twice a day  .  fluticasone (FLONASE) 50 MCG/ACT nasal spray instill 1 spray into each nostril twice a day  . hydrochlorothiazide (HYDRODIURIL) 25 MG tablet take 1 tablet by mouth once daily  . pantoprazole (PROTONIX) 40 MG tablet take 1 tablet by mouth once daily  . RA ALLERGY RELIEF 180 MG tablet take 1 tablet by mouth once daily  . VENTOLIN HFA 108 (90 BASE) MCG/ACT inhaler Take 2 puffs by mouth Every 4 hours as needed.  Marland Kitchen amoxicillin-clavulanate (AUGMENTIN) 875-125 MG per tablet Take 1 tablet by mouth 2 (two) times daily.  . ergocalciferol (DRISDOL) 50000 UNITS capsule Take 1 capsule (50,000 Units total) by mouth once a week. (Patient not taking: Reported on 05/13/2014)  . potassium chloride SA (K-DUR,KLOR-CON) 20 MEQ tablet Take 1 tablet (20 mEq total) by mouth daily. (Patient not taking: Reported on 05/13/2014)  . predniSONE (DELTASONE) 10 MG tablet 6 tablets on Day 1 , then reduce by 1 tablet daily until gone  . Vitamin D, Ergocalciferol, (DRISDOL) 50000 UNITS CAPS Take 1 capsule (50,000 Units total) by mouth every 7 (seven) days. (Patient not taking: Reported on 05/13/2014)     No orders of the defined types were placed in this encounter.    No Follow-up on file.

## 2014-05-15 ENCOUNTER — Encounter: Payer: Self-pay | Admitting: Internal Medicine

## 2014-05-15 DIAGNOSIS — Z90710 Acquired absence of both cervix and uterus: Secondary | ICD-10-CM | POA: Insufficient documentation

## 2014-05-15 DIAGNOSIS — Z90722 Acquired absence of ovaries, bilateral: Secondary | ICD-10-CM

## 2014-05-15 DIAGNOSIS — Z86010 Personal history of colonic polyps: Secondary | ICD-10-CM | POA: Insufficient documentation

## 2014-05-15 DIAGNOSIS — Z9079 Acquired absence of other genital organ(s): Secondary | ICD-10-CM

## 2014-05-15 DIAGNOSIS — J0100 Acute maxillary sinusitis, unspecified: Secondary | ICD-10-CM | POA: Insufficient documentation

## 2014-05-15 NOTE — Assessment & Plan Note (Signed)
With signs of bilateral otitis.  Augmentin,  prednisone taper, sinus rinses, probiotic.

## 2014-05-25 ENCOUNTER — Telehealth: Payer: Self-pay

## 2014-05-25 NOTE — Telephone Encounter (Signed)
PA request for Flovent HFA has been started on cover my meds. Awaiting a response at this time.

## 2014-06-05 ENCOUNTER — Telehealth: Payer: Self-pay | Admitting: Internal Medicine

## 2014-06-05 DIAGNOSIS — J4530 Mild persistent asthma, uncomplicated: Secondary | ICD-10-CM

## 2014-06-05 NOTE — Telephone Encounter (Signed)
Patient called office for Flovent inhaler to see if something else can be prescribed because insurance is refusing to pay for Flovent any longer. Please advise.

## 2014-06-08 NOTE — Telephone Encounter (Signed)
ALL INHALERS ARE EXPENSIVE.  DID THEY GIVE HER A LIST OF COVERED INHALERS? She has no diagnosis of asthma or copd so she may not need one

## 2014-06-09 DIAGNOSIS — J45909 Unspecified asthma, uncomplicated: Secondary | ICD-10-CM

## 2014-06-09 HISTORY — DX: Unspecified asthma, uncomplicated: J45.909

## 2014-06-09 NOTE — Telephone Encounter (Signed)
Patient stated that she was prescribed by Dr. Brett Albino at [facility] before seeing current MD for asthma and allergies patient stated she has been taking 2 puffs BID for several years.  Patient is going to call insurance and see what is on formulary or why they will not pay for Flovent. Patient will then return call.

## 2014-06-22 ENCOUNTER — Other Ambulatory Visit: Payer: Self-pay | Admitting: Internal Medicine

## 2014-08-12 ENCOUNTER — Encounter: Payer: Self-pay | Admitting: Internal Medicine

## 2014-08-12 ENCOUNTER — Ambulatory Visit (INDEPENDENT_AMBULATORY_CARE_PROVIDER_SITE_OTHER): Payer: Managed Care, Other (non HMO) | Admitting: Internal Medicine

## 2014-08-12 VITALS — BP 126/84 | HR 80 | Temp 98.0°F | Resp 16 | Ht 64.5 in | Wt 211.5 lb

## 2014-08-12 DIAGNOSIS — E669 Obesity, unspecified: Secondary | ICD-10-CM

## 2014-08-12 DIAGNOSIS — Z1159 Encounter for screening for other viral diseases: Secondary | ICD-10-CM

## 2014-08-12 DIAGNOSIS — Z1239 Encounter for other screening for malignant neoplasm of breast: Secondary | ICD-10-CM

## 2014-08-12 DIAGNOSIS — Z0000 Encounter for general adult medical examination without abnormal findings: Secondary | ICD-10-CM | POA: Diagnosis not present

## 2014-08-12 DIAGNOSIS — J4520 Mild intermittent asthma, uncomplicated: Secondary | ICD-10-CM

## 2014-08-12 DIAGNOSIS — E785 Hyperlipidemia, unspecified: Secondary | ICD-10-CM

## 2014-08-12 DIAGNOSIS — R5383 Other fatigue: Secondary | ICD-10-CM | POA: Diagnosis not present

## 2014-08-12 DIAGNOSIS — E559 Vitamin D deficiency, unspecified: Secondary | ICD-10-CM

## 2014-08-12 NOTE — Progress Notes (Signed)
Patient ID: Donna Richards, female    DOB: 1950/04/05  Age: 64 y.o. MRN: [id number]  The patient is here for annual  wellness examination and management of other chronic and acute problems.   The risk factors are reflected in the social history.  The roster of all physicians providing medical care to patient - is listed in the Snapshot section of the chart.  Activities of daily living:  The patient is 100% independent in all ADLs: dressing, toileting, feeding as well as independent mobility  Home safety : The patient has smoke detectors in the home. They wear seatbelts.  There are no firearms at home. There is no violence in the home.   There is no risks for hepatitis, STDs or HIV. There is no   history of blood transfusion. They have no travel history to infectious disease endemic areas of the world.  The patient has seen their dentist in the last six month. They have seen their eye doctor in the last year. They admit to slight hearing difficulty with regard to whispered voices and some television programs.  They have deferred audiologic testing in the last year.  They do not  have excessive sun exposure. Discussed the need for sun protection: hats, long sleeves and use of sunscreen if there is significant sun exposure.   Diet: the importance of a healthy diet is discussed. They do have a healthy diet.  The benefits of regular aerobic exercise were discussed. She walks 4 times per week ,  20 minutes.   Depression screen: there are no signs or vegative symptoms of depression- irritability, change in appetite, anhedonia, sadness/tearfullness.  Cognitive assessment: the patient manages all their financial and personal affairs and is actively engaged. They could relate day,date,year and events; recalled 2/3 objects at 3 minutes; performed clock-face test normally.  The following portions of the patient's history were reviewed and updated as appropriate: allergies, current medications, past family  history, past medical history,  past surgical history, past social history  and problem list.  Visual acuity was not assessed per patient preference since she has regular follow up with her ophthalmologist. Hearing and body mass index were assessed and reviewed.   During the course of the visit the patient was educated and counseled about appropriate screening and preventive services including : fall prevention , diabetes screening, nutrition counseling, colorectal cancer screening, and recommended immunizations.    CC: The primary encounter diagnosis was Asthma, chronic, mild intermittent, uncomplicated. Diagnoses of Hyperlipidemia with target LDL less than 100, Obesity (BMI 30-39.9), and Visit for preventive health examination were also pertinent to this visit.  History Donna Richards has a past medical history of Asthma; Arthritis; GERD (gastroesophageal reflux disease); Allergy; Hyperlipidemia; Migraines; Inflammatory polyps of colon; Stroke (1992); and UTI (urinary tract infection).   She has past surgical history that includes Bilateral oophorectomy and Abdominal hysterectomy (1996).   Her family history includes COPD in her father; Diabetes in her maternal grandmother and mother; Heart disease in her father, maternal grandfather, and mother; Hyperlipidemia in her mother; Kidney disease in her mother; Stroke in her paternal grandmother.She reports that she has never smoked. She does not have any smokeless tobacco history on file. She reports that she does not drink alcohol or use illicit drugs.  Outpatient Prescriptions Prior to Visit  Medication Sig Dispense Refill  . CRESTOR 5 MG tablet take 1 tablet by mouth once daily 30 tablet 5  . fluticasone (FLONASE) 50 MCG/ACT nasal spray instill 1 spray into each  nostril twice a day 16 g 5  . hydrochlorothiazide (HYDRODIURIL) 25 MG tablet take 1 tablet by mouth once daily 30 tablet 5  . pantoprazole (PROTONIX) 40 MG tablet take 1 tablet by mouth once  daily 30 tablet 5  . RA ALLERGY RELIEF 180 MG tablet take 1 tablet by mouth once daily 30 tablet 11  . VENTOLIN HFA 108 (90 BASE) MCG/ACT inhaler Take 2 puffs by mouth Every 4 hours as needed.    Marland Kitchen amoxicillin-clavulanate (AUGMENTIN) 875-125 MG per tablet Take 1 tablet by mouth 2 (two) times daily. (Patient not taking: Reported on 08/12/2014) 14 tablet 0  . ergocalciferol (DRISDOL) 50000 UNITS capsule Take 1 capsule (50,000 Units total) by mouth once a week. (Patient not taking: Reported on 05/13/2014) 4 capsule 0  . FLOVENT HFA 110 MCG/ACT inhaler inhale 1 puff by mouth twice a day (Patient not taking: Reported on 08/12/2014) 12 g 5  . potassium chloride SA (K-DUR,KLOR-CON) 20 MEQ tablet Take 1 tablet (20 mEq total) by mouth daily. (Patient not taking: Reported on 05/13/2014) 5 tablet 0  . predniSONE (DELTASONE) 10 MG tablet 6 tablets on Day 1 , then reduce by 1 tablet daily until gone (Patient not taking: Reported on 08/12/2014) 21 tablet 0  . Vitamin D, Ergocalciferol, (DRISDOL) 50000 UNITS CAPS Take 1 capsule (50,000 Units total) by mouth every 7 (seven) days. (Patient not taking: Reported on 05/13/2014) 30 capsule 0   No facility-administered medications prior to visit.    Review of Systems   Patient denies headache, fevers, malaise, unintentional weight loss, skin rash, eye pain, sinus congestion and sinus pain, sore throat, dysphagia,  hemoptysis , cough, dyspnea, wheezing, chest pain, palpitations, orthopnea, edema, abdominal pain, nausea, melena, diarrhea, constipation, flank pain, dysuria, hematuria, urinary  Frequency, nocturia, numbness, tingling, seizures,  Focal weakness, Loss of consciousness,  Tremor, insomnia, depression, anxiety, and suicidal ideation.      Objective:  BP 126/84 mmHg  Pulse 80  Temp(Src) 98 F (36.7 C) (Oral)  Resp 16  Ht 5' 4.5" (1.638 m)  Wt 211 lb 8 oz (95.936 kg)  BMI 35.76 kg/m2  SpO2 98%  Physical Exam  General appearance: alert, cooperative and  appears stated age Head: Normocephalic, without obvious abnormality, atraumatic Eyes: conjunctivae/corneas clear. PERRL, EOM's intact. Fundi benign. Ears: normal TM's and external ear canals both ears Nose: Nares normal. Septum midline. Mucosa normal. No drainage or sinus tenderness. Throat: lips, mucosa, and tongue normal; teeth and gums normal Neck: no adenopathy, no carotid bruit, no JVD, supple, symmetrical, trachea midline and thyroid not enlarged, symmetric, no tenderness/mass/nodules Lungs: clear to auscultation bilaterally Breasts: normal appearance, no masses or tenderness Heart: regular rate and rhythm, S1, S2 normal, no murmur, click, rub or gallop Abdomen: soft, non-tender; bowel sounds normal; no masses,  no organomegaly Extremities: extremities normal, atraumatic, no cyanosis or edema Pulses: 2+ and symmetric Skin: Skin color, texture, turgor normal. No rashes or lesions Neurologic: Alert and oriented X 3, normal strength and tone. Normal symmetric reflexes. Normal coordination and gait.   Assessment & Plan:   Problem List Items Addressed This Visit    Obesity (BMI 30-39.9)    I have addressed  BMI and recommended wt loss of 10% of body weigh over the next 6 months using a low glycemic index diet and regular exercise a minimum of 5 days per week.        Visit for preventive health examination    Annual wellness  exam was done as well  as a comprehensive physical exam and management of acute and chronic conditions .  During the course of the visit the patient was educated and counseled about appropriate screening and preventive services including :  diabetes screening, lipid analysis with projected  10 year  risk for CAD , nutrition counseling, colorectal cancer screening, and recommended immunizations.  Printed recommendations for health maintenance screenings was given.        Hyperlipidemia with target LDL less than 100    Managed with statin therapy.   She is overdu for  repeat testing , no changes today.      Asthma, chronic - Primary    Patient is no longer using flovent due to insurance non coverage. She has had no exacerbations requiring use of albuterol rescue inhaler in over 6  months          I am having Ms. Manz maintain her VENTOLIN HFA, Vitamin D (Ergocalciferol), potassium chloride SA, ergocalciferol, CRESTOR, RA ALLERGY RELIEF, FLOVENT HFA, hydrochlorothiazide, pantoprazole, amoxicillin-clavulanate, predniSONE, and fluticasone.  No orders of the defined types were placed in this encounter.    There are no discontinued medications.  Follow-up: Return in about 6 months (around 02/12/2015).   Crecencio Mc, MD

## 2014-08-12 NOTE — Patient Instructions (Signed)
Please return for fasting labs as soon as it is convenient  I will order your annual screening mammogram at [facility] in early June    Health Maintenance Adopting a healthy lifestyle and getting preventive care can go a long way to promote health and wellness. Talk with your health care provider about what schedule of regular examinations is right for you. This is a good chance for you to check in with your provider about disease prevention and staying healthy. In between checkups, there are plenty of things you can do on your own. Experts have done a lot of research about which lifestyle changes and preventive measures are most likely to keep you healthy. Ask your health care provider for more information. WEIGHT AND DIET  Eat a healthy diet  Be sure to include plenty of vegetables, fruits, low-fat dairy products, and lean protein.  Do not eat a lot of foods high in solid fats, added sugars, or salt.  Get regular exercise. This is one of the most important things you can do for your health.  Most adults should exercise for at least 150 minutes each week. The exercise should increase your heart rate and make you sweat (moderate-intensity exercise).  Most adults should also do strengthening exercises at least twice a week. This is in addition to the moderate-intensity exercise.  Maintain a healthy weight  Body mass index (BMI) is a measurement that can be used to identify possible weight problems. It estimates body fat based on height and weight. Your health care provider can help determine your BMI and help you achieve or maintain a healthy weight.  For females 32 years of age and older:   A BMI below 18.5 is considered underweight.  A BMI of 18.5 to 24.9 is normal.  A BMI of 25 to 29.9 is considered overweight.  A BMI of 30 and above is considered obese.  Watch levels of cholesterol and blood lipids  You should start having your blood tested for lipids and cholesterol at 63 years of  age, then have this test every 5 years.  You may need to have your cholesterol levels checked more often if:  Your lipid or cholesterol levels are high.  You are older than 63 years of age.  You are at high risk for heart disease.  CANCER SCREENING   Lung Cancer  Lung cancer screening is recommended for adults 51-62 years old who are at high risk for lung cancer because of a history of smoking.  A yearly low-dose CT scan of the lungs is recommended for people who:  Currently smoke.  Have quit within the past 15 years.  Have at least a 30-pack-year history of smoking. A pack year is smoking an average of one pack of cigarettes a day for 1 year.  Yearly screening should continue until it has been 15 years since you quit.  Yearly screening should stop if you develop a health problem that would prevent you from having lung cancer treatment.  Breast Cancer  Practice breast self-awareness. This means understanding how your breasts normally appear and feel.  It also means doing regular breast self-exams. Let your health care provider know about any changes, no matter how small.  If you are in your 20s or 30s, you should have a clinical breast exam (CBE) by a health care provider every 1-3 years as part of a regular health exam.  If you are 52 or older, have a CBE every year. Also consider having a breast  X-ray (mammogram) every year.  If you have a family history of breast cancer, talk to your health care provider about genetic screening.  If you are at high risk for breast cancer, talk to your health care provider about having an MRI and a mammogram every year.  Breast cancer gene (BRCA) assessment is recommended for women who have family members with BRCA-related cancers. BRCA-related cancers include:  Breast.  Ovarian.  Tubal.  Peritoneal cancers.  Results of the assessment will determine the need for genetic counseling and BRCA1 and BRCA2 testing. Cervical  Cancer Routine pelvic examinations to screen for cervical cancer are no longer recommended for nonpregnant women who are considered low risk for cancer of the pelvic organs (ovaries, uterus, and vagina) and who do not have symptoms. A pelvic examination may be necessary if you have symptoms including those associated with pelvic infections. Ask your health care provider if a screening pelvic exam is right for you.   The Pap test is the screening test for cervical cancer for women who are considered at risk.  If you had a hysterectomy for a problem that was not cancer or a condition that could lead to cancer, then you no longer need Pap tests.  If you are older than 65 years, and you have had normal Pap tests for the past 10 years, you no longer need to have Pap tests.  If you have had past treatment for cervical cancer or a condition that could lead to cancer, you need Pap tests and screening for cancer for at least 20 years after your treatment.  If you no longer get a Pap test, assess your risk factors if they change (such as having a new sexual partner). This can affect whether you should start being screened again.  Some women have medical problems that increase their chance of getting cervical cancer. If this is the case for you, your health care provider may recommend more frequent screening and Pap tests.  The human papillomavirus (HPV) test is another test that may be used for cervical cancer screening. The HPV test looks for the virus that can cause cell changes in the cervix. The cells collected during the Pap test can be tested for HPV.  The HPV test can be used to screen women 93 years of age and older. Getting tested for HPV can extend the interval between normal Pap tests from three to five years.  An HPV test also should be used to screen women of any age who have unclear Pap test results.  After 63 years of age, women should have HPV testing as often as Pap tests.  Colorectal  Cancer  This type of cancer can be detected and often prevented.  Routine colorectal cancer screening usually begins at 63 years of age and continues through 63 years of age.  Your health care provider may recommend screening at an earlier age if you have risk factors for colon cancer.  Your health care provider may also recommend using home test kits to check for hidden blood in the stool.  A small camera at the end of a tube can be used to examine your colon directly (sigmoidoscopy or colonoscopy). This is done to check for the earliest forms of colorectal cancer.  Routine screening usually begins at age 84.  Direct examination of the colon should be repeated every 5-10 years through 63 years of age. However, you may need to be screened more often if early forms of precancerous polyps or small  growths are found. Skin Cancer  Check your skin from head to toe regularly.  Tell your health care provider about any new moles or changes in moles, especially if there is a change in a mole's shape or color.  Also tell your health care provider if you have a mole that is larger than the size of a pencil eraser.  Always use sunscreen. Apply sunscreen liberally and repeatedly throughout the day.  Protect yourself by wearing long sleeves, pants, a wide-brimmed hat, and sunglasses whenever you are outside. HEART DISEASE, DIABETES, AND HIGH BLOOD PRESSURE   Have your blood pressure checked at least every 1-2 years. High blood pressure causes heart disease and increases the risk of stroke.  If you are between 74 years and 8 years old, ask your health care provider if you should take aspirin to prevent strokes.  Have regular diabetes screenings. This involves taking a blood sample to check your fasting blood sugar level.  If you are at a normal weight and have a low risk for diabetes, have this test once every three years after 63 years of age.  If you are overweight and have a high risk for  diabetes, consider being tested at a younger age or more often. PREVENTING INFECTION  Hepatitis B  If you have a higher risk for hepatitis B, you should be screened for this virus. You are considered at high risk for hepatitis B if:  You were born in a country where hepatitis B is common. Ask your health care provider which countries are considered high risk.  Your parents were born in a high-risk country, and you have not been immunized against hepatitis B (hepatitis B vaccine).  You have HIV or AIDS.  You use needles to inject street drugs.  You live with someone who has hepatitis B.  You have had sex with someone who has hepatitis B.  You get hemodialysis treatment.  You take certain medicines for conditions, including cancer, organ transplantation, and autoimmune conditions. Hepatitis C  Blood testing is recommended for:  Everyone born from [birth year] through 1964.  Anyone with known risk factors for hepatitis C. Sexually transmitted infections (STIs)  You should be screened for sexually transmitted infections (STIs) including gonorrhea and chlamydia if:  You are sexually active and are younger than 63 years of age.  You are older than 63 years of age and your health care provider tells you that you are at risk for this type of infection.  Your sexual activity has changed since you were last screened and you are at an increased risk for chlamydia or gonorrhea. Ask your health care provider if you are at risk.  If you do not have HIV, but are at risk, it may be recommended that you take a prescription medicine daily to prevent HIV infection. This is called pre-exposure prophylaxis (PrEP). You are considered at risk if:  You are sexually active and do not regularly use condoms or know the HIV status of your partner(s).  You take drugs by injection.  You are sexually active with a partner who has HIV. Talk with your health care provider about whether you are at high risk of  being infected with HIV. If you choose to begin PrEP, you should first be tested for HIV. You should then be tested every 3 months for as long as you are taking PrEP.  PREGNANCY   If you are premenopausal and you may become pregnant, ask your health care provider about preconception counseling.  If you may become pregnant, take 400 to 800 micrograms (mcg) of folic acid every day.  If you want to prevent pregnancy, talk to your health care provider about birth control (contraception). OSTEOPOROSIS AND MENOPAUSE   Osteoporosis is a disease in which the bones lose minerals and strength with aging. This can result in serious bone fractures. Your risk for osteoporosis can be identified using a bone density scan.  If you are 65 years of age or older, or if you are at risk for osteoporosis and fractures, ask your health care provider if you should be screened.  Ask your health care provider whether you should take a calcium or vitamin D supplement to lower your risk for osteoporosis.  Menopause may have certain physical symptoms and risks.  Hormone replacement therapy may reduce some of these symptoms and risks. Talk to your health care provider about whether hormone replacement therapy is right for you.  HOME CARE INSTRUCTIONS   Schedule regular health, dental, and eye exams.  Stay current with your immunizations.   Do not use any tobacco products including cigarettes, chewing tobacco, or electronic cigarettes.  If you are pregnant, do not drink alcohol.  If you are breastfeeding, limit how much and how often you drink alcohol.  Limit alcohol intake to no more than 1 drink per day for nonpregnant women. One drink equals 12 ounces of beer, 5 ounces of wine, or 1 ounces of hard liquor.  Do not use street drugs.  Do not share needles.  Ask your health care provider for help if you need support or information about quitting drugs.  Tell your health care provider if you often feel  depressed.  Tell your health care provider if you have ever been abused or do not feel safe at home. Document Released: 09/19/2010 Document Revised: 07/21/2013 Document Reviewed: 02/05/2013 [employer] Patient Information 2015 [employer]. This information is not intended to replace advice given to you by your health care provider. Make sure you discuss any questions you have with your health care provider.  

## 2014-08-12 NOTE — Progress Notes (Signed)
Pre-visit discussion using our clinic review tool. No additional management support is needed unless otherwise documented below in the visit note.  

## 2014-08-15 ENCOUNTER — Encounter: Payer: Self-pay | Admitting: Internal Medicine

## 2014-08-15 NOTE — Assessment & Plan Note (Signed)

## 2014-08-15 NOTE — Assessment & Plan Note (Signed)
Managed with statin therapy.   She is overdu for repeat testing , no changes today.

## 2014-08-15 NOTE — Assessment & Plan Note (Signed)
I have addressed  BMI and recommended wt loss of 10% of body weigh over the next 6 months using a low glycemic index diet and regular exercise a minimum of 5 days per week.   

## 2014-08-15 NOTE — Assessment & Plan Note (Signed)
Patient is no longer using flovent due to insurance non coverage. She has had no exacerbations requiring use of albuterol rescue inhaler in over 6  months

## 2014-08-19 ENCOUNTER — Other Ambulatory Visit (INDEPENDENT_AMBULATORY_CARE_PROVIDER_SITE_OTHER): Payer: Managed Care, Other (non HMO)

## 2014-08-19 DIAGNOSIS — E559 Vitamin D deficiency, unspecified: Secondary | ICD-10-CM | POA: Diagnosis not present

## 2014-08-19 DIAGNOSIS — E669 Obesity, unspecified: Secondary | ICD-10-CM

## 2014-08-19 DIAGNOSIS — E785 Hyperlipidemia, unspecified: Secondary | ICD-10-CM

## 2014-08-19 DIAGNOSIS — R5383 Other fatigue: Secondary | ICD-10-CM | POA: Diagnosis not present

## 2014-08-19 DIAGNOSIS — Z1159 Encounter for screening for other viral diseases: Secondary | ICD-10-CM

## 2014-08-19 LAB — COMPREHENSIVE METABOLIC PANEL
ALBUMIN: 4.2 g/dL (ref 3.5–5.2)
ALT: 19 U/L (ref 0–35)
AST: 25 U/L (ref 0–37)
Alkaline Phosphatase: 35 U/L — ABNORMAL LOW (ref 39–117)
BUN: 13 mg/dL (ref 6–23)
CHLORIDE: 101 meq/L (ref 96–112)
CO2: 33 meq/L — AB (ref 19–32)
CREATININE: 1 mg/dL (ref 0.40–1.20)
Calcium: 9.6 mg/dL (ref 8.4–10.5)
GFR: 59.51 mL/min — AB (ref >60.00)
Glucose, Bld: 97 mg/dL (ref 70–99)
POTASSIUM: 4 meq/L (ref 3.5–5.1)
SODIUM: 139 meq/L (ref 135–145)
Total Bilirubin: 0.7 mg/dL (ref 0.2–1.2)
Total Protein: 7.2 g/dL (ref 6.0–8.3)

## 2014-08-19 LAB — CBC WITH DIFFERENTIAL/PLATELET
Basophils Absolute: 0 10*3/uL (ref 0.0–0.1)
Basophils Relative: 0.5 % (ref 0.0–3.0)
EOS PCT: 1.5 % (ref 0.0–5.0)
Eosinophils Absolute: 0.1 10*3/uL (ref 0.0–0.7)
HCT: 37.8 % (ref 36.0–46.0)
Hemoglobin: 12.1 g/dL (ref 12.0–15.0)
LYMPHS PCT: 23 % (ref 12.0–46.0)
Lymphs Abs: 1.2 10*3/uL (ref 0.7–4.0)
MCHC: 32.1 g/dL (ref 30.0–36.0)
MCV: 78.1 fl (ref 78.0–100.0)
Monocytes Absolute: 0.6 10*3/uL (ref 0.1–1.0)
Monocytes Relative: 11.8 % (ref 3.0–12.0)
NEUTROS PCT: 63.2 % (ref 43.0–77.0)
Neutro Abs: 3.3 10*3/uL (ref 1.4–7.7)
Platelets: 240 10*3/uL (ref 150.0–400.0)
RBC: 4.84 Mil/uL (ref 3.87–5.11)
RDW: 16.4 % — AB (ref 11.5–15.5)
WBC: 5.2 10*3/uL (ref 4.0–10.5)

## 2014-08-19 LAB — LIPID PANEL
CHOL/HDL RATIO: 2
CHOLESTEROL: 149 mg/dL (ref 0–200)
HDL: 61.3 mg/dL (ref >39.00)
LDL Cholesterol: 76 mg/dL (ref 0–99)
NONHDL: 87.7
Triglycerides: 61 mg/dL (ref 0.0–149.0)
VLDL: 12.2 mg/dL (ref 0.0–40.0)

## 2014-08-19 LAB — VITAMIN D 25 HYDROXY (VIT D DEFICIENCY, FRACTURES): VITD: 17.26 ng/mL — ABNORMAL LOW (ref 30.00–100.00)

## 2014-08-19 LAB — TSH: TSH: 1.21 u[IU]/mL (ref 0.35–4.50)

## 2014-08-20 ENCOUNTER — Other Ambulatory Visit: Payer: Self-pay | Admitting: Internal Medicine

## 2014-08-20 LAB — HEPATITIS C ANTIBODY: HCV AB: NEGATIVE

## 2014-08-23 ENCOUNTER — Other Ambulatory Visit: Payer: Self-pay | Admitting: Internal Medicine

## 2014-08-23 ENCOUNTER — Encounter: Payer: Self-pay | Admitting: Internal Medicine

## 2014-08-23 DIAGNOSIS — E559 Vitamin D deficiency, unspecified: Secondary | ICD-10-CM | POA: Insufficient documentation

## 2014-08-23 MED ORDER — ERGOCALCIFEROL 1.25 MG (50000 UT) PO CAPS: 50000.0000 [IU] | ORAL_CAPSULE | ORAL | Status: DC

## 2014-08-24 ENCOUNTER — Other Ambulatory Visit: Payer: Self-pay | Admitting: Internal Medicine

## 2014-12-03 ENCOUNTER — Encounter: Payer: Self-pay | Admitting: Family Medicine

## 2014-12-03 ENCOUNTER — Ambulatory Visit (INDEPENDENT_AMBULATORY_CARE_PROVIDER_SITE_OTHER): Payer: Managed Care, Other (non HMO) | Admitting: Family Medicine

## 2014-12-03 ENCOUNTER — Ambulatory Visit
Admission: RE | Admit: 2014-12-03 | Discharge: 2014-12-03 | Disposition: A | Discharge status: Home / Self Care | Payer: Managed Care, Other (non HMO) | Source: Ambulatory Visit | Attending: Family Medicine | Admitting: Family Medicine

## 2014-12-03 ENCOUNTER — Telehealth: Payer: Self-pay | Admitting: Family Medicine

## 2014-12-03 VITALS — BP 126/80 | HR 86 | Temp 98.3°F | Ht 64.5 in | Wt 208.8 lb

## 2014-12-03 DIAGNOSIS — K529 Noninfective gastroenteritis and colitis, unspecified: Secondary | ICD-10-CM | POA: Insufficient documentation

## 2014-12-03 DIAGNOSIS — Z90710 Acquired absence of both cervix and uterus: Secondary | ICD-10-CM | POA: Insufficient documentation

## 2014-12-03 DIAGNOSIS — K5730 Diverticulosis of large intestine without perforation or abscess without bleeding: Secondary | ICD-10-CM | POA: Diagnosis not present

## 2014-12-03 DIAGNOSIS — R1032 Left lower quadrant pain: Secondary | ICD-10-CM

## 2014-12-03 LAB — COMPREHENSIVE METABOLIC PANEL
ALT: 18 U/L (ref 0–35)
AST: 19 U/L (ref 0–37)
Albumin: 4.3 g/dL (ref 3.5–5.2)
Alkaline Phosphatase: 37 U/L — ABNORMAL LOW (ref 39–117)
BUN: 13 mg/dL (ref 6–23)
CO2: 32 mEq/L (ref 19–32)
Calcium: 9.8 mg/dL (ref 8.4–10.5)
Chloride: 99 mEq/L (ref 96–112)
Creatinine, Ser: 0.97 mg/dL (ref 0.40–1.20)
GFR: 61.59 mL/min (ref >60.00)
GLUCOSE: 102 mg/dL — AB (ref 70–99)
Potassium: 3.7 mEq/L (ref 3.5–5.1)
SODIUM: 140 meq/L (ref 135–145)
Total Bilirubin: 1.3 mg/dL — ABNORMAL HIGH (ref 0.2–1.2)
Total Protein: 7.2 g/dL (ref 6.0–8.3)

## 2014-12-03 LAB — CBC WITH DIFFERENTIAL/PLATELET
BASOS PCT: 0.5 % (ref 0.0–3.0)
Basophils Absolute: 0 10*3/uL (ref 0.0–0.1)
Eosinophils Absolute: 0 10*3/uL (ref 0.0–0.7)
Eosinophils Relative: 0.4 % (ref 0.0–5.0)
HCT: 37.3 % (ref 36.0–46.0)
Hemoglobin: 12 g/dL (ref 12.0–15.0)
LYMPHS ABS: 0.9 10*3/uL (ref 0.7–4.0)
Lymphocytes Relative: 14.5 % (ref 12.0–46.0)
MCHC: 32.1 g/dL (ref 30.0–36.0)
MCV: 78.7 fl (ref 78.0–100.0)
MONO ABS: 0.6 10*3/uL (ref 0.1–1.0)
Monocytes Relative: 9.8 % (ref 3.0–12.0)
NEUTROS PCT: 74.8 % (ref 43.0–77.0)
Neutro Abs: 4.6 10*3/uL (ref 1.4–7.7)
Platelets: 262 10*3/uL (ref 150.0–400.0)
RBC: 4.75 Mil/uL (ref 3.87–5.11)
RDW: 14.9 % (ref 11.5–15.5)
WBC: 6.1 10*3/uL (ref 4.0–10.5)

## 2014-12-03 LAB — POCT URINALYSIS DIPSTICK
Blood, UA: NEGATIVE
Glucose, UA: NEGATIVE
Leukocytes, UA: NEGATIVE
Nitrite, UA: NEGATIVE
PROTEIN UA: NEGATIVE
SPEC GRAV UA: 1.015
Urobilinogen, UA: 0.2
pH, UA: 6.5

## 2014-12-03 MED ORDER — CIPROFLOXACIN HCL 500 MG PO TABS: 500.0000 mg | ORAL_TABLET | Freq: Two times a day (BID) | ORAL | Status: DC

## 2014-12-03 MED ORDER — IOHEXOL 300 MG/ML  SOLN
100.0000 mL | Freq: Once | INTRAMUSCULAR | Status: AC | PRN
  Administered 2014-12-03: 100 mL via INTRAVENOUS

## 2014-12-03 MED ORDER — METRONIDAZOLE 500 MG PO TABS: 500.0000 mg | ORAL_TABLET | Freq: Four times a day (QID) | ORAL | Status: DC

## 2014-12-03 NOTE — Progress Notes (Addendum)
Patient ID: Donna Richards, female   DOB: 1951-03-12, 64 y.o.   MRN: [id number]  Tommi Rumps, MD Phone: [phone]  Donna Richards is a 64 y.o. female who presents today for same day appointment.  Patient notes onset of left back pain gradually radiating to left lower quadrant of her abdomen 3 days ago. Notes the pain is equally bad in each location at this time. States this has become a severe pain. She notes it felt as though it was initially in her tail bone and gradually moved around to her abdomen. She notes nausea and vomiting over the past 3 days. This is clear in nature. Notes she has been drinking ok, though unable to tolerate food. Denies diarrhea, blood in stool, dysuria, frequency, urgency, vaginal discharge, and vaginal bleeding. She has a history of complete hysterectomy. Still has her appendix. She notes this discomfort is more noticeable when she stands up and she gets the urge to vomit. She notes she feels ok at this time. No abdominal pain or back pain at this time. No history of cancer, saddle anesthesia, incontinence, or fever. No weakness or numbness. Had a colonoscopy in 2014 with 2 polyps and diverticula noted.   PMH: nonsmoker. History of diverticula on colonoscopy.   ROS see HPI  Objective  Physical Exam Filed Vitals:   12/03/14 0917  BP: 126/80  Pulse: 86  Temp: 98.3 F (36.8 C)    Physical Exam  Constitutional: She is well-developed, well-nourished, and in no distress.  Well appearing  HENT:  Head: Normocephalic and atraumatic.  Mouth/Throat: Oropharynx is clear and moist. No oropharyngeal exudate.  Cardiovascular: Normal rate, regular rhythm and normal heart sounds.  Exam reveals no gallop and no friction rub.   No murmur heard. Pulmonary/Chest: Effort normal and breath sounds normal. No respiratory distress. She has no wheezes. She has no rales.  Abdominal: Soft. Bowel sounds are normal.  Tenderness in left lower quadrant, with intermittent  guarding over the site of tenderness, more often had no guarding over site of tenderness, she has no tenderness elsewhere in her abdomen, she has no guarding elsewhere in abdomen, she has no rebound, she moves easily around the room with no apparent abdominal pain or discomfort, no masses palpated, no skin changes or induration over the area of discomfort  Genitourinary:  Normal labia and vaginal mucosa, no discharge, no cervix appreciated, no tenderness on bimanual exam  Musculoskeletal:  No midline spine tenderness, no muscular back tenderness  Neurological: She is alert. Gait normal.  5/5 strength in bilateral quads, hamstrings, plantar and dorsiflexion, sensation to light touch intact in bilateral LE, normal gait, 2+ patellar reflexes  Skin: Skin is warm and dry. No rash noted. She is not diaphoretic.     Assessment/Plan: Please see individual problem list.  Abdominal pain, left lower quadrant Patient with 3 days of intermittent left lower quadrant abdominal pain that started in her left low back and gradually radiated to her LLQ abdomen. She is well appearing at this time and vitals are stable. The location of the discomfort and her prior colo with diverticula are concerning for diverticulitis. She had infrequent intermittent guarding in the area of tenderness and once had severe pain on palpation of the area, though during most of the palpation of the tender area she had no guarding and noted moderate pain. She has no other guarding or rebound to indicate an acute abdomen. UA negative for infection. She is non-tender in her back so doubt this is  the cause of her pain. Normal pelvic exam and lack of uterus and ovaries makes GU cause unlikely. Doubt appendicitis given location of discomfort. Given stable vitals and well appearance in absence of diffuse peritoneal signs and persistent abdominal pain patient is stable for outpatient workup. Will obtain stat CT abd/pelvis with contrast given report of  severe pain and intermittent guarding. This was scheduled for noon today. Will start on cipro/flagyl for diverticulitis. Advised on clear liquid diet. Will obtain CMET and CBC with diff. Discussed that if she were to develop persistent abdominal pain, worsening in pain, fever, inability to tolerate clear liquids, persistent vomiting, diarrhea, blood in stool, or if she had any changes in symptoms she should seek medical attention.     Orders Placed This Encounter  Procedures  . CT Abdomen Pelvis W Contrast    Standing Status: Future     Number of Occurrences:      Standing Expiration Date: 03/03/2016    Order Specific Question:  Reason for Exam (SYMPTOM  OR DIAGNOSIS REQUIRED)    Answer:  left lower quadrant pain    Order Specific Question:  Preferred imaging location?    Answer:  [facility]  . CBC w/Diff  . Comp Met (CMET)  . POCT Urinalysis Dipstick    Meds ordered this encounter  Medications  . ciprofloxacin (CIPRO) 500 MG tablet    Sig: Take 1 tablet (500 mg total) by mouth 2 (two) times daily.    Dispense:  14 tablet    Refill:  0  . metroNIDAZOLE (FLAGYL) 500 MG tablet    Sig: Take 1 tablet (500 mg total) by mouth 4 (four) times daily.    Dispense:  28 tablet    Refill:  0    Tommi Rumps

## 2014-12-03 NOTE — Patient Instructions (Signed)
Nice to meet you. You likely have diverticulitis.  We will obtain a CT scan of your abdomen and pelvis to evaluate for this.  We will start you on ciprofloxacin and flagyl to treat this. Limit your diet to clear liquids.  If you have worsening pain, movement of your abdominal pain, vomiting, inability to keep clear liquids down, fever, chills, blood in your stool, or feel poorly please seek medical attention.   Diverticulitis Diverticulitis is inflammation or infection of small pouches in your colon that form when you have a condition called diverticulosis. The pouches in your colon are called diverticula. Your colon, or large intestine, is where water is absorbed and stool is formed. Complications of diverticulitis can include:  Bleeding.  Severe infection.  Severe pain.  Perforation of your colon.  Obstruction of your colon. CAUSES  Diverticulitis is caused by bacteria. Diverticulitis happens when stool becomes trapped in diverticula. This allows bacteria to grow in the diverticula, which can lead to inflammation and infection. RISK FACTORS People with diverticulosis are at risk for diverticulitis. Eating a diet that does not include enough fiber from fruits and vegetables may make diverticulitis more likely to develop. SYMPTOMS  Symptoms of diverticulitis may include:  Abdominal pain and tenderness. The pain is normally located on the left side of the abdomen, but may occur in other areas.  Fever and chills.  Bloating.  Cramping.  Nausea.  Vomiting.  Constipation.  Diarrhea.  Blood in your stool. DIAGNOSIS  Your health care provider will ask you about your medical history and do a physical exam. You may need to have tests done because many medical conditions can cause the same symptoms as diverticulitis. Tests may include:  Blood tests.  Urine tests.  Imaging tests of the abdomen, including X-rays and CT scans. When your condition is under control, your health  care provider may recommend that you have a colonoscopy. A colonoscopy can show how severe your diverticula are and whether something else is causing your symptoms. TREATMENT  Most cases of diverticulitis are mild and can be treated at home. Treatment may include:  Taking over-the-counter pain medicines.  Following a clear liquid diet.  Taking antibiotic medicines by mouth for 7-10 days. More severe cases may be treated at a hospital. Treatment may include:  Not eating or drinking.  Taking prescription pain medicine.  Receiving antibiotic medicines through an IV tube.  Receiving fluids and nutrition through an IV tube.  Surgery. HOME CARE INSTRUCTIONS   Follow your health care provider's instructions carefully.  Follow a full liquid diet or other diet as directed by your health care provider. After your symptoms improve, your health care provider may tell you to change your diet. He or she may recommend you eat a high-fiber diet. Fruits and vegetables are good sources of fiber. Fiber makes it easier to pass stool.  Take fiber supplements or probiotics as directed by your health care provider.  Only take medicines as directed by your health care provider.  Keep all your follow-up appointments. SEEK MEDICAL CARE IF:   Your pain does not improve.  You have a hard time eating food.  Your bowel movements do not return to normal. SEEK IMMEDIATE MEDICAL CARE IF:   Your pain becomes worse.  Your symptoms do not get better.  Your symptoms suddenly get worse.  You have a fever.  You have repeated vomiting.  You have bloody or black, tarry stools. MAKE SURE YOU:   Understand these instructions.  Will watch  your condition.  Will get help right away if you are not doing well or get worse. Document Released: 12/14/2004 Document Revised: 03/11/2013 Document Reviewed: 01/29/2013 [facility] Patient Information 2015 [place], [place]. This information is not intended to replace  advice given to you by your health care provider. Make sure you discuss any questions you have with your health care provider.

## 2014-12-03 NOTE — Assessment & Plan Note (Addendum)
Patient with 3 days of intermittent left lower quadrant abdominal pain that started in her left low back and gradually radiated to her LLQ abdomen. She is well appearing at this time and vitals are stable. The location of the discomfort and her prior colo with diverticula are concerning for diverticulitis. She had infrequent intermittent guarding in the area of tenderness and once had severe pain on palpation of the area, though during most of the palpation of the tender area she had no guarding and noted moderate pain. She has no other guarding or rebound to indicate an acute abdomen. UA negative for infection. She is non-tender in her back so doubt this is the cause of her pain. Normal pelvic exam and lack of uterus and ovaries makes GU cause unlikely. Doubt appendicitis given location of discomfort. Given stable vitals and well appearance in absence of diffuse peritoneal signs and persistent abdominal pain patient is stable for outpatient workup. Will obtain stat CT abd/pelvis with contrast given report of severe pain and intermittent guarding. This was scheduled for noon today. Will start on cipro/flagyl for diverticulitis. Advised on clear liquid diet. Will obtain CMET and CBC with diff. Discussed that if she were to develop persistent abdominal pain, worsening in pain, fever, inability to tolerate clear liquids, persistent vomiting, diarrhea, blood in stool, or if she had any changes in symptoms she should seek medical attention.

## 2014-12-03 NOTE — Progress Notes (Signed)
Pre visit review using our clinic review tool, if applicable. No additional management support is needed unless otherwise documented below in the visit note. 

## 2014-12-03 NOTE — Telephone Encounter (Signed)
Called patient to inform of her CT results. Advised that it showed enteritis and explained that this is inflammation of the small intestine. There is no obstruction, appendiceal issue, diverticulitis, abscess, or other apparent intraabdominal cause for this discomfort. Pain is likely related to the enteritis. 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 Discussed that she should start on the antibiotics. Should maintain clear liquid diet until abdominal pain is improved. Advised on return precautions. Patient will follow-up on Monday for recheck. If develops any return precautions she will seek medical attention prior to follow-up.

## 2014-12-07 ENCOUNTER — Ambulatory Visit (INDEPENDENT_AMBULATORY_CARE_PROVIDER_SITE_OTHER): Payer: Managed Care, Other (non HMO) | Admitting: Family Medicine

## 2014-12-07 ENCOUNTER — Encounter: Payer: Self-pay | Admitting: Family Medicine

## 2014-12-07 VITALS — BP 118/82 | HR 68 | Temp 98.0°F | Ht 64.5 in | Wt 207.0 lb

## 2014-12-07 DIAGNOSIS — H538 Other visual disturbances: Secondary | ICD-10-CM

## 2014-12-07 DIAGNOSIS — K529 Noninfective gastroenteritis and colitis, unspecified: Secondary | ICD-10-CM | POA: Diagnosis not present

## 2014-12-07 DIAGNOSIS — S39012A Strain of muscle, fascia and tendon of lower back, initial encounter: Secondary | ICD-10-CM | POA: Diagnosis not present

## 2014-12-07 NOTE — Patient Instructions (Signed)
Nice to see you. I am glad you are improved.  Please complete the course of antibiotics. You can use heat on your back and rest.  Please watch your vision and if this continues to be an issue seek medical attention.  If you develop abdominal pain, nausea, vomiting, diarrhea, blood in your stool, weakness, numbness, vision changes, speech changes, or fever please seek medical attention.

## 2014-12-07 NOTE — Progress Notes (Signed)
Pre visit review using our clinic review tool, if applicable. No additional management support is needed unless otherwise documented below in the visit note. 

## 2014-12-07 NOTE — Progress Notes (Signed)
Patient ID: Donna Richards, female   DOB: 08/16/1950, 64 y.o.   MRN: [id number]  Tommi Rumps, MD Phone: [phone]  Donna Richards is a 63 y.o. female who presents today for f/u.  Enteritis: patient reports she is improved from this issue. Notes minimal abdominal discomfort over the past day. None at this time. No nausea or vomiting today, though did have some yesterday. Small amount of loose stools yesterday. No blood in stool. No fevers. Feels much improved.   Back pain: notes back is bothering her in the left lower back. Is an ache when she flexes or extends her back. Only bothers her with these movements. No urinary complaints. No fever, weakness, numbness, saddle anesthesia, incontinence, or history of cancer. Note she had arthritic changes on CT scan. Feels this is improving as well.   Vision change: notes that she has a haze over her vision only after getting up in the morning. Goes away after 45 minutes. No double vision, numbness, weakness, headache, or light headedness. No eye pain or redness. No vision issues at this time. Notes she has been taking phenergan from her sister.   PMH: nonsmoker.   ROS see HPI  Objective  Physical Exam Filed Vitals:   12/07/14 1319  BP: 118/82  Pulse: 68  Temp: 98 F (36.7 C)    Physical Exam  Constitutional: She is well-developed, well-nourished, and in no distress.  HENT:  Head: Normocephalic and atraumatic.  Right Ear: External ear normal.  Left Ear: External ear normal.  Mouth/Throat: Oropharynx is clear and moist. No oropharyngeal exudate.  Eyes: Conjunctivae are normal. Pupils are equal, round, and reactive to light.  Fundus exam unable to be completed given pupillary constriction  Neck: Neck supple.  Cardiovascular: Normal rate, regular rhythm and normal heart sounds.  Exam reveals no gallop and no friction rub.   No murmur heard. Pulmonary/Chest: Effort normal and breath sounds normal. No respiratory distress. She has no  wheezes.  Abdominal: Soft. Bowel sounds are normal. She exhibits no distension. There is no tenderness. There is no rebound and no guarding.  Musculoskeletal:  No midline spine tenderness, there is mild spasm in the left lumbar paraspinous muscles with no swelling, no other tenderness in back  Lymphadenopathy:    She has no cervical adenopathy.  Neurological: She is alert.  CN 2-12 intact, 5/5 strength in bilateral biceps, triceps, grip, quads, hamstrings, plantar and dorsiflexion, sensation to light touch intact in bilateral UE and LE, normal gait, 2+ patellar reflexes  Skin: Skin is warm and dry. She is not diaphoretic.     Assessment/Plan: Please see individual problem list.  Enteritis Enteritis seen on CT scan is likely cause of discomfort. Abdominal pain is improved. Still taking cipro/flagyl for this. Vitals stable and is well appearing. Benign abdominal exam. Will complete the course of antibiotics. Can start bland foods as long as she has no discomfort with this, then advance diet as tolerated if she has no discomfort. If discomfort persists or recurs she will follow-up. Given return precautions.   Lumbar spine strain Patient with paraspinous muscle strain and spasm based on history and exam. No red flags. Neurologically intact. Discussed medication options for this, though patient declined medication at this time. Will apply heat to the area and relative rest. Given return precautions.   Hazy vision Patient with haze to vision after waking in the morning since this current illness started and started taking phenergan. No issues at this time. No other neurological abnormalities. Vision  normal on check today. Discussed further evaluation for this including optho and scan of brain to evaluate for any lesions, though patient declined these opting to monitor until acute illness is resolved. Advised to stop phenergan as this could cause this symptoms. Will continue to monitor per patient  choice. Given return precautions.     Tommi Rumps

## 2014-12-09 DIAGNOSIS — S39012A Strain of muscle, fascia and tendon of lower back, initial encounter: Secondary | ICD-10-CM | POA: Insufficient documentation

## 2014-12-09 DIAGNOSIS — H538 Other visual disturbances: Secondary | ICD-10-CM | POA: Insufficient documentation

## 2014-12-09 NOTE — Assessment & Plan Note (Signed)
Patient with paraspinous muscle strain and spasm based on history and exam. No red flags. Neurologically intact. Discussed medication options for this, though patient declined medication at this time. Will apply heat to the area and relative rest. Given return precautions.

## 2014-12-09 NOTE — Assessment & Plan Note (Signed)
Enteritis seen on CT scan is likely cause of discomfort. Abdominal pain is improved. Still taking cipro/flagyl for this. Vitals stable and is well appearing. Benign abdominal exam. Will complete the course of antibiotics. Can start bland foods as long as she has no discomfort with this, then advance diet as tolerated if she has no discomfort. If discomfort persists or recurs she will follow-up. Given return precautions.

## 2014-12-09 NOTE — Assessment & Plan Note (Signed)
Patient with haze to vision after waking in the morning since this current illness started and started taking phenergan. No issues at this time. No other neurological abnormalities. Vision normal on check today. Discussed further evaluation for this including optho and scan of brain to evaluate for any lesions, though patient declined these opting to monitor until acute illness is resolved. Advised to stop phenergan as this could cause this symptoms. Will continue to monitor per patient choice. Given return precautions.

## 2015-02-21 ENCOUNTER — Other Ambulatory Visit: Payer: Self-pay | Admitting: Internal Medicine

## 2015-02-22 NOTE — Telephone Encounter (Signed)
Please advise as Patient has had two Visits with Dr. Caryl Bis for acute issues.

## 2015-02-24 NOTE — Telephone Encounter (Signed)
Ok to refill,  Refill sent  

## 2015-03-30 DIAGNOSIS — J4520 Mild intermittent asthma, uncomplicated: Secondary | ICD-10-CM | POA: Insufficient documentation

## 2015-07-28 ENCOUNTER — Encounter: Payer: Self-pay | Admitting: Nurse Practitioner

## 2015-07-28 ENCOUNTER — Ambulatory Visit (INDEPENDENT_AMBULATORY_CARE_PROVIDER_SITE_OTHER): Payer: Managed Care, Other (non HMO) | Admitting: Nurse Practitioner

## 2015-07-28 VITALS — BP 102/66 | HR 74 | Temp 98.1°F | Ht 64.5 in | Wt 214.4 lb

## 2015-07-28 DIAGNOSIS — L989 Disorder of the skin and subcutaneous tissue, unspecified: Secondary | ICD-10-CM | POA: Diagnosis not present

## 2015-07-28 MED ORDER — DOXYCYCLINE HYCLATE 100 MG PO TABS: 100.0000 mg | ORAL_TABLET | Freq: Two times a day (BID) | ORAL | Status: DC

## 2015-07-28 MED ORDER — NYSTATIN 100000 UNIT/GM EX OINT: 1.0000 "application " | TOPICAL_OINTMENT | Freq: Two times a day (BID) | CUTANEOUS | Status: DC

## 2015-07-28 NOTE — Patient Instructions (Addendum)
Doxycyline as directed for 7 days.  Ice pack for discomfort  Nystatin ointment twice daily for barrier and anti-yeast care  Please take a probiotic ( Align, Floraque or Culturelle) while you are on the antibiotic to prevent a serious antibiotic associated diarrhea  Called clostirudium dificile colitis and a vaginal yeast infection. Generics are great and take for 3 weeks.   Call us Friday if not somewhat improving.

## 2015-07-28 NOTE — Assessment & Plan Note (Signed)
New onset [place] with doxycycline twice daily 7 days, patient reports she does have a probiotic at home and will take for 3 weeks. We'll also treat with nystatin ointment to keep the barrier on the site and treat for possible yeast involvement. Asked patient to call Friday if there is no indication that it is even slightly improving.

## 2015-07-28 NOTE — Progress Notes (Signed)
Patient ID: Donna Richards, female    DOB: May 01, 1950  Age: 65 y.o. MRN: [id number]  CC: Acute Visit   HPI Donna Richards presents for CC of 3 weeks of posterior right knee lesion.   1) patient reports a enlarging lesion on the posterior side of her right leg in the popliteal space. She reports this was noticed about 2-1/2 closer to 3 weeks ago. It has grown in size, she reports her pain leg is sticking to the area which makes her think it is draining somewhat. She has had her coworkers take pictures of it and look at it at work. They referred her back to the doctor's office.   Patient denies any treatment to date Patient reports it is pruritic Patient reports her sister passed over a month ago and she was helping clean her fistula and thinks this may have something to do with it. She also currently has a stye on her left upper eyelid.  History Donna Richards has a past medical history of Asthma; Arthritis; GERD (gastroesophageal reflux disease); Allergy; Hyperlipidemia; Migraines; Inflammatory polyps of colon ([place]); Stroke [facility]) (1992); and UTI (urinary tract infection).   She has past surgical history that includes Bilateral oophorectomy and Abdominal hysterectomy (1996).   Her family history includes COPD in her father; Diabetes in her maternal grandmother and mother; Heart disease in her father, maternal grandfather, and mother; Hyperlipidemia in her mother; Kidney disease in her mother; Stroke in her paternal grandmother.She reports that she has never smoked. She does not have any smokeless tobacco history on file. She reports that she does not drink alcohol or use illicit drugs.  Outpatient Prescriptions Prior to Visit  Medication Sig Dispense Refill  . CRESTOR 5 MG tablet take 1 tablet by mouth once daily 30 tablet 5  . fluticasone (FLONASE) 50 MCG/ACT nasal spray instill 1 spray into each nostril twice a day 16 g 5  . hydrochlorothiazide (HYDRODIURIL) 25 MG tablet take 1 tablet by mouth  once daily 30 tablet 5  . pantoprazole (PROTONIX) 40 MG tablet take 1 tablet by mouth once daily 30 tablet 5  . RA ALLERGY RELIEF 180 MG tablet take 1 tablet by mouth once daily 30 tablet 11  . VENTOLIN HFA 108 (90 BASE) MCG/ACT inhaler Take 2 puffs by mouth Every 4 hours as needed.    . ciprofloxacin (CIPRO) 500 MG tablet Take 1 tablet (500 mg total) by mouth 2 (two) times daily. 14 tablet 0  . ergocalciferol (DRISDOL) 50000 UNITS capsule Take 1 capsule (50,000 Units total) by mouth once a week. 12 capsule 0  . metroNIDAZOLE (FLAGYL) 500 MG tablet Take 1 tablet (500 mg total) by mouth 4 (four) times daily. 28 tablet 0  . Vitamin D, Ergocalciferol, (DRISDOL) 50000 UNITS CAPS Take 1 capsule (50,000 Units total) by mouth every 7 (seven) days. 30 capsule 0   No facility-administered medications prior to visit.    ROS Review of Systems  Constitutional: Negative for fever, chills, diaphoresis and fatigue.  Eyes: Negative for photophobia, pain, discharge, redness, itching and visual disturbance.  Respiratory: Negative for chest tightness, shortness of breath and wheezing.   Cardiovascular: Negative for chest pain, palpitations and leg swelling.  Gastrointestinal: Negative for nausea, vomiting and diarrhea.  Skin: Positive for color change, rash and wound. Negative for pallor.  Neurological: Negative for dizziness, weakness, numbness and headaches.  Psychiatric/Behavioral: The patient is not nervous/anxious.     Objective:  BP 102/66 mmHg  Pulse 74  Temp(Src) 98.1 F (  36.7 C) (Oral)  Ht 5' 4.5" (1.638 m)  Wt 214 lb 6 oz (97.24 kg)  BMI 36.24 kg/m2  SpO2 98%  Physical Exam  Constitutional: She is oriented to person, place, and time. She appears well-developed and well-nourished. No distress.  HENT:  Head: Normocephalic and atraumatic.  Right Ear: External ear normal.  Left Ear: External ear normal.  Cardiovascular: Normal rate, regular rhythm and normal heart sounds.     Pulmonary/Chest: Effort normal and breath sounds normal. No respiratory distress. She has no wheezes. She has no rales. She exhibits no tenderness.  Neurological: She is alert and oriented to person, place, and time. No cranial nerve deficit. She exhibits normal muscle tone. Coordination normal.  Skin: Skin is warm and dry. Rash noted. She is not diaphoretic.     A 2 inch area of the right popliteal space is erythematous, excoriated, weeping serous fluid. It is very mildly tender to palpation, is indurated, there is no puncture site nor other indication of bite.  Psychiatric: She has a normal mood and affect. Her behavior is normal. Judgment and thought content normal.   Assessment & Plan:   Donna Richards was seen today for acute visit.  Diagnoses and all orders for this visit:  Skin lesion of right leg  Other orders -     doxycycline (VIBRA-TABS) 100 MG tablet; Take 1 tablet (100 mg total) by mouth 2 (two) times daily. -     nystatin ointment (MYCOSTATIN); Apply 1 application topically 2 (two) times daily. Apply thin layer twice daily on areas for 2 weeks  I have discontinued Ms. Bramhall's Vitamin D (Ergocalciferol), ergocalciferol, ciprofloxacin, and metroNIDAZOLE. I am also having her start on doxycycline and nystatin ointment. Additionally, I am having her maintain her VENTOLIN HFA, RA ALLERGY RELIEF, fluticasone, CRESTOR, hydrochlorothiazide, and pantoprazole.  Meds ordered this encounter  Medications  . doxycycline (VIBRA-TABS) 100 MG tablet    Sig: Take 1 tablet (100 mg total) by mouth 2 (two) times daily.    Dispense:  14 tablet    Refill:  0    Order Specific Question:  Supervising Provider    Answer:  Deborra Medina L [[id number]]  . nystatin ointment (MYCOSTATIN)    Sig: Apply 1 application topically 2 (two) times daily. Apply thin layer twice daily on areas for 2 weeks    Dispense:  30 g    Refill:  0    Order Specific Question:  Supervising Provider    Answer:  Crecencio Mc  [[id number]]     Follow-up: Return if symptoms worsen or fail to improve.

## 2015-07-28 NOTE — Progress Notes (Signed)
Pre visit review using our clinic review tool, if applicable. No additional management support is needed unless otherwise documented below in the visit note. 

## 2015-08-13 ENCOUNTER — Encounter: Payer: Managed Care, Other (non HMO) | Admitting: Internal Medicine

## 2015-08-19 ENCOUNTER — Encounter: Payer: Self-pay | Admitting: Internal Medicine

## 2015-08-19 ENCOUNTER — Ambulatory Visit (INDEPENDENT_AMBULATORY_CARE_PROVIDER_SITE_OTHER): Payer: Managed Care, Other (non HMO) | Admitting: Internal Medicine

## 2015-08-19 VITALS — BP 104/80 | HR 70 | Ht 64.5 in | Wt 211.4 lb

## 2015-08-19 DIAGNOSIS — E785 Hyperlipidemia, unspecified: Secondary | ICD-10-CM | POA: Diagnosis not present

## 2015-08-19 DIAGNOSIS — D126 Benign neoplasm of colon, unspecified: Secondary | ICD-10-CM

## 2015-08-19 DIAGNOSIS — L989 Disorder of the skin and subcutaneous tissue, unspecified: Secondary | ICD-10-CM

## 2015-08-19 DIAGNOSIS — Z0000 Encounter for general adult medical examination without abnormal findings: Secondary | ICD-10-CM | POA: Diagnosis not present

## 2015-08-19 DIAGNOSIS — E559 Vitamin D deficiency, unspecified: Secondary | ICD-10-CM

## 2015-08-19 DIAGNOSIS — E669 Obesity, unspecified: Secondary | ICD-10-CM

## 2015-08-19 DIAGNOSIS — R5383 Other fatigue: Secondary | ICD-10-CM

## 2015-08-19 DIAGNOSIS — J4520 Mild intermittent asthma, uncomplicated: Secondary | ICD-10-CM

## 2015-08-19 DIAGNOSIS — Z1239 Encounter for other screening for malignant neoplasm of breast: Secondary | ICD-10-CM

## 2015-08-19 LAB — LDL CHOLESTEROL, DIRECT: LDL DIRECT: 81 mg/dL

## 2015-08-19 LAB — CBC WITH DIFFERENTIAL/PLATELET
BASOS ABS: 0 10*3/uL (ref 0.0–0.1)
BASOS PCT: 0.7 % (ref 0.0–3.0)
Eosinophils Absolute: 0.1 10*3/uL (ref 0.0–0.7)
Eosinophils Relative: 1.8 % (ref 0.0–5.0)
HEMATOCRIT: 36.1 % (ref 36.0–46.0)
Hemoglobin: 11.5 g/dL — ABNORMAL LOW (ref 12.0–15.0)
LYMPHS ABS: 1.3 10*3/uL (ref 0.7–4.0)
LYMPHS PCT: 24.3 % (ref 12.0–46.0)
MCHC: 31.8 g/dL (ref 30.0–36.0)
MCV: 75.3 fl — AB (ref 78.0–100.0)
MONOS PCT: 9.2 % (ref 3.0–12.0)
Monocytes Absolute: 0.5 10*3/uL (ref 0.1–1.0)
NEUTROS ABS: 3.5 10*3/uL (ref 1.4–7.7)
NEUTROS PCT: 64 % (ref 43.0–77.0)
PLATELETS: 267 10*3/uL (ref 150.0–400.0)
RBC: 4.8 Mil/uL (ref 3.87–5.11)
RDW: 17.5 % — AB (ref 11.5–15.5)
WBC: 5.4 10*3/uL (ref 4.0–10.5)

## 2015-08-19 LAB — COMPREHENSIVE METABOLIC PANEL
ALBUMIN: 4.1 g/dL (ref 3.5–5.2)
ALT: 16 U/L (ref 0–35)
AST: 19 U/L (ref 0–37)
Alkaline Phosphatase: 34 U/L — ABNORMAL LOW (ref 39–117)
BUN: 13 mg/dL (ref 6–23)
CHLORIDE: 100 meq/L (ref 96–112)
CO2: 33 mEq/L — ABNORMAL HIGH (ref 19–32)
CREATININE: 1 mg/dL (ref 0.40–1.20)
Calcium: 9.6 mg/dL (ref 8.4–10.5)
GFR: 59.32 mL/min — ABNORMAL LOW (ref >60.00)
GLUCOSE: 91 mg/dL (ref 70–99)
Potassium: 4 mEq/L (ref 3.5–5.1)
SODIUM: 140 meq/L (ref 135–145)
Total Bilirubin: 1 mg/dL (ref 0.2–1.2)
Total Protein: 7.1 g/dL (ref 6.0–8.3)

## 2015-08-19 LAB — LIPID PANEL
CHOL/HDL RATIO: 3
Cholesterol: 163 mg/dL (ref 0–200)
HDL: 58.6 mg/dL (ref >39.00)
LDL Cholesterol: 84 mg/dL (ref 0–99)
NONHDL: 104.4
Triglycerides: 101 mg/dL (ref 0.0–149.0)
VLDL: 20.2 mg/dL (ref 0.0–40.0)

## 2015-08-19 LAB — VITAMIN D 25 HYDROXY (VIT D DEFICIENCY, FRACTURES): VITD: 21.21 ng/mL — AB (ref 30.00–100.00)

## 2015-08-19 LAB — TSH: TSH: 1.47 u[IU]/mL (ref 0.35–4.50)

## 2015-08-19 MED ORDER — CLOBETASOL PROPIONATE 0.05 % EX LIQD: Freq: Two times a day (BID) | CUTANEOUS | Status: DC

## 2015-08-19 NOTE — Patient Instructions (Addendum)
I HAVE PRESCRIBED A STEROID SPRAY TO USE TWICE DAILY ON THE SPOT BEHIND YOUR KNEE.   IF NO IMPROVEMENT IN 2 WEEKS CALL FOR DERMATOLOGY REFERRAL  Mammogram  Is being ordered at [facility]  Next colonoscopy is due in 2019  Menopause is a normal process in which your reproductive ability comes to an end. This process happens gradually over a span of months to years, usually between the ages of 42 and 3. Menopause is complete when you have missed 12 consecutive menstrual periods. It is important to talk with your health care provider about some of the most common conditions that affect postmenopausal women, such as heart disease, cancer, and bone loss (osteoporosis). Adopting a healthy lifestyle and getting preventive care can help to promote your health and wellness. Those actions can also lower your chances of developing some of these common conditions. WHAT SHOULD I KNOW ABOUT MENOPAUSE? During menopause, you may experience a number of symptoms, such as:  Moderate-to-severe hot flashes.  Night sweats.  Decrease in sex drive.  Mood swings.  Headaches.  Tiredness.  Irritability.  Memory problems.  Insomnia. Choosing to treat or not to treat menopausal changes is an individual decision that you make with your health care provider. WHAT SHOULD I KNOW ABOUT HORMONE REPLACEMENT THERAPY AND SUPPLEMENTS? Hormone therapy products are effective for treating symptoms that are associated with menopause, such as hot flashes and night sweats. Hormone replacement carries certain risks, especially as you become older. If you are thinking about using estrogen or estrogen with progestin treatments, discuss the benefits and risks with your health care provider. WHAT SHOULD I KNOW ABOUT HEART DISEASE AND STROKE? Heart disease, heart attack, and stroke become more likely as you age. This may be due, in part, to the hormonal changes that your body experiences during menopause. These can affect how your body  processes dietary fats, triglycerides, and cholesterol. Heart attack and stroke are both medical emergencies. There are many things that you can do to help prevent heart disease and stroke:  Have your blood pressure checked at least every 1-2 years. High blood pressure causes heart disease and increases the risk of stroke.  If you are 56-47 years old, ask your health care provider if you should take aspirin to prevent a heart attack or a stroke.  Do not use any tobacco products, including cigarettes, chewing tobacco, or electronic cigarettes. If you need help quitting, ask your health care provider.  It is important to eat a healthy diet and maintain a healthy weight.  Be sure to include plenty of vegetables, fruits, low-fat dairy products, and lean protein.  Avoid eating foods that are high in solid fats, added sugars, or salt (sodium).  Get regular exercise. This is one of the most important things that you can do for your health.  Try to exercise for at least 150 minutes each week. The type of exercise that you do should increase your heart rate and make you sweat. This is known as moderate-intensity exercise.  Try to do strengthening exercises at least twice each week. Do these in addition to the moderate-intensity exercise.  Know your numbers.Ask your health care provider to check your cholesterol and your blood glucose. Continue to have your blood tested as directed by your health care provider. WHAT SHOULD I KNOW ABOUT CANCER SCREENING? There are several types of cancer. Take the following steps to reduce your risk and to catch any cancer development as early as possible. Breast Cancer  Practice breast self-awareness.  This means understanding how your breasts normally appear and feel.  It also means doing regular breast self-exams. Let your health care provider know about any changes, no matter how small.  If you are 83 or older, have a clinician do a breast exam (clinical  breast exam or CBE) every year. Depending on your age, family history, and medical history, it may be recommended that you also have a yearly breast X-ray (mammogram).  If you have a family history of breast cancer, talk with your health care provider about genetic screening.  If you are at high risk for breast cancer, talk with your health care provider about having an MRI and a mammogram every year.  Breast cancer (BRCA) gene test is recommended for women who have family members with BRCA-related cancers. Results of the assessment will determine the need for genetic counseling and BRCA1 and for BRCA2 testing. BRCA-related cancers include these types:  Breast. This occurs in males or females.  Ovarian.  Tubal. This may also be called fallopian tube cancer.  Cancer of the abdominal or pelvic lining (peritoneal cancer).  Prostate.  Pancreatic. Cervical, Uterine, and Ovarian Cancer Your health care provider may recommend that you be screened regularly for cancer of the pelvic organs. These include your ovaries, uterus, and vagina. This screening involves a pelvic exam, which includes checking for microscopic changes to the surface of your cervix (Pap test).  For women ages 21-65, health care providers may recommend a pelvic exam and a Pap test every three years. For women ages 76-65, they may recommend the Pap test and pelvic exam, combined with testing for human papilloma virus (HPV), every five years. Some types of HPV increase your risk of cervical cancer. Testing for HPV may also be done on women of any age who have unclear Pap test results.  Other health care providers may not recommend any screening for nonpregnant women who are considered low risk for pelvic cancer and have no symptoms. Ask your health care provider if a screening pelvic exam is right for you.  If you have had past treatment for cervical cancer or a condition that could lead to cancer, you need Pap tests and screening  for cancer for at least 20 years after your treatment. If Pap tests have been discontinued for you, your risk factors (such as having a new sexual partner) need to be reassessed to determine if you should start having screenings again. Some women have medical problems that increase the chance of getting cervical cancer. In these cases, your health care provider may recommend that you have screening and Pap tests more often.  If you have a family history of uterine cancer or ovarian cancer, talk with your health care provider about genetic screening.  If you have vaginal bleeding after reaching menopause, tell your health care provider.  There are currently no reliable tests available to screen for ovarian cancer. Lung Cancer Lung cancer screening is recommended for adults 10-35 years old who are at high risk for lung cancer because of a history of smoking. A yearly low-dose CT scan of the lungs is recommended if you:  Currently smoke.  Have a history of at least 30 pack-years of smoking and you currently smoke or have quit within the past 15 years. A pack-year is smoking an average of one pack of cigarettes per day for one year. Yearly screening should:  Continue until it has been 15 years since you quit.  Stop if you develop a health problem  that would prevent you from having lung cancer treatment. Colorectal Cancer  This type of cancer can be detected and can often be prevented.  Routine colorectal cancer screening usually begins at age 69 and continues through age 65.  If you have risk factors for colon cancer, your health care provider may recommend that you be screened at an earlier age.  If you have a family history of colorectal cancer, talk with your health care provider about genetic screening.  Your health care provider may also recommend using home test kits to check for hidden blood in your stool.  A small camera at the end of a tube can be used to examine your colon  directly (sigmoidoscopy or colonoscopy). This is done to check for the earliest forms of colorectal cancer.  Direct examination of the colon should be repeated every 5-10 years until age 47. However, if early forms of precancerous polyps or small growths are found or if you have a family history or genetic risk for colorectal cancer, you may need to be screened more often. Skin Cancer  Check your skin from head to toe regularly.  Monitor any moles. Be sure to tell your health care provider:  About any new moles or changes in moles, especially if there is a change in a mole's shape or color.  If you have a mole that is larger than the size of a pencil eraser.  If any of your family members has a history of skin cancer, especially at a young age, talk with your health care provider about genetic screening.  Always use sunscreen. Apply sunscreen liberally and repeatedly throughout the day.  Whenever you are outside, protect yourself by wearing long sleeves, pants, a wide-brimmed hat, and sunglasses. WHAT SHOULD I KNOW ABOUT OSTEOPOROSIS? Osteoporosis is a condition in which bone destruction happens more quickly than new bone creation. After menopause, you may be at an increased risk for osteoporosis. To help prevent osteoporosis or the bone fractures that can happen because of osteoporosis, the following is recommended:  If you are 36-43 years old, get at least 1,000 mg of calcium and at least 600 mg of vitamin D per day.  If you are older than age 66 but younger than age 35, get at least 1,200 mg of calcium and at least 600 mg of vitamin D per day.  If you are older than age 37, get at least 1,200 mg of calcium and at least 800 mg of vitamin D per day. Smoking and excessive alcohol intake increase the risk of osteoporosis. Eat foods that are rich in calcium and vitamin D, and do weight-bearing exercises several times each week as directed by your health care provider. WHAT SHOULD I KNOW  ABOUT HOW MENOPAUSE AFFECTS [place]? Depression may occur at any age, but it is more common as you become older. Common symptoms of depression include:  Low or sad mood.  Changes in sleep patterns.  Changes in appetite or eating patterns.  Feeling an overall lack of motivation or enjoyment of activities that you previously enjoyed.  Frequent crying spells. Talk with your health care provider if you think that you are experiencing depression. WHAT SHOULD I KNOW ABOUT IMMUNIZATIONS? It is important that you get and maintain your immunizations. These include:  Tetanus, diphtheria, and pertussis (Tdap) booster vaccine.  Influenza every year before the flu season begins.  Pneumonia vaccine.  Shingles vaccine. Your health care provider may also recommend other immunizations.   This information is  not intended to replace advice given to you by your health care provider. Make sure you discuss any questions you have with your health care provider.   Document Released: 04/28/2005 Document Revised: 03/27/2014 Document Reviewed: 11/06/2013 Elsevier Interactive Patient Education [employer].

## 2015-08-19 NOTE — Assessment & Plan Note (Signed)
She is now s/p doxy x 1 week,  Nystatin x 2 weeks, and area is still pruritic and swollen.  Steroid  Cream prescribed

## 2015-08-19 NOTE — Progress Notes (Signed)
Patient ID: HERO PREM, female    DOB: 18-Jan-1951  Age: 64 y.o. MRN: [id number]  The patient is here for annualellness examination and management of other chronic and acute problems.    SHE IS S/P tah/bso 1996 due to endometriosis, fibroids .  NO cervix per 2015 pelvic exam (me)  MAMMOGRAM overdue.  Last done in 2015 with additional images.  Stye was treated   The risk factors are reflected in the social history.  The roster of all physicians providing medical care to patient - is listed in the Snapshot section of the chart.  Activities of daily living:  The patient is 100% independent in all ADLs: dressing, toileting, feeding as well as independent mobility  Home safety : The patient has smoke detectors in the home. They wear seatbelts.  There are no firearms at home. There is no violence in the home.   There is no risks for hepatitis, STDs or HIV. There is no   history of blood transfusion. They have no travel history to infectious disease endemic areas of the world.  The patient has seen their dentist in the last six month. They have seen their eye doctor in the last year. They admit to slight hearing difficulty with regard to whispered voices and some television programs.  They have deferred audiologic testing in the last year.  They do not  have excessive sun exposure. Discussed the need for sun protection: hats, long sleeves and use of sunscreen if there is significant sun exposure.   Diet: the importance of a healthy diet is discussed. They do have a healthy diet.  The benefits of regular aerobic exercise were discussed. She walks 4 times per week ,  20 minutes.   Depression screen: there are no signs or vegative symptoms of depression- irritability, change in appetite, anhedonia, sadness/tearfullness.  Cognitive assessment: the patient manages all their financial and personal affairs and is actively engaged. They could relate day,date,year and events; recalled 2/3 objects at 3  minutes; performed clock-face test normally.  The following portions of the patient's history were reviewed and updated as appropriate: allergies, current medications, past family history, past medical history,  past surgical history, past social history  and problem list.  Visual acuity was not assessed per patient preference since she has regular follow up with her ophthalmologist. Hearing and body mass index were assessed and reviewed.   During the course of the visit the patient was educated and counseled about appropriate screening and preventive services including : fall prevention , diabetes screening, nutrition counseling, colorectal cancer screening, and recommended immunizations.    CC: The primary encounter diagnosis was Skin lesion of right leg. Diagnoses of Hyperlipidemia, Other fatigue, Vitamin D deficiency, Breast cancer screening, Adenomatous colon polyp, Obesity (BMI 30-39.9), Visit for preventive health examination, Hyperlipidemia with target LDL less than 100, and Asthma, chronic, mild intermittent, uncomplicated were also pertinent to this visit.    STILL HAVING swelling and itching of the right popliteal fossa after being  Treated with doxy for 7 days  in May .  Area is swollen wit punctatee read areas, ,  In the crease. Has been using antifungal cream  Nystatin gtwice daily for the past two weeks,  HAS NOT TRIED A STEROID CREAM   History Kevina has a past medical history of Asthma; Arthritis; GERD (gastroesophageal reflux disease); Allergy; Hyperlipidemia; Migraines; Inflammatory polyps of colon ([place]); Stroke [facility]) (1992); and UTI (urinary tract infection).   She has past surgical history that includes  Bilateral oophorectomy and Abdominal hysterectomy (1996).   Her family history includes COPD in her father; Diabetes in her maternal grandmother and mother; Heart disease in her father, maternal grandfather, and mother; Hyperlipidemia in her mother; Kidney disease in her  mother; Stroke in her paternal grandmother.She reports that she has never smoked. She does not have any smokeless tobacco history on file. She reports that she does not drink alcohol or use illicit drugs.  Outpatient Prescriptions Prior to Visit  Medication Sig Dispense Refill  . CRESTOR 5 MG tablet take 1 tablet by mouth once daily 30 tablet 5  . fluticasone (FLONASE) 50 MCG/ACT nasal spray instill 1 spray into each nostril twice a day 16 g 5  . hydrochlorothiazide (HYDRODIURIL) 25 MG tablet take 1 tablet by mouth once daily 30 tablet 5  . nystatin ointment (MYCOSTATIN) Apply 1 application topically 2 (two) times daily. Apply thin layer twice daily on areas for 2 weeks 30 g 0  . pantoprazole (PROTONIX) 40 MG tablet take 1 tablet by mouth once daily 30 tablet 5  . RA ALLERGY RELIEF 180 MG tablet take 1 tablet by mouth once daily 30 tablet 11  . doxycycline (VIBRA-TABS) 100 MG tablet Take 1 tablet (100 mg total) by mouth 2 (two) times daily. (Patient not taking: Reported on 08/19/2015) 14 tablet 0  . VENTOLIN HFA 108 (90 BASE) MCG/ACT inhaler Take 2 puffs by mouth Every 4 hours as needed. Reported on 08/19/2015     No facility-administered medications prior to visit.    Review of Systems   Patient denies headache, fevers, malaise, unintentional weight loss, skin rash, eye pain, sinus congestion and sinus pain, sore throat, dysphagia,  hemoptysis , cough, dyspnea, wheezing, chest pain, palpitations, orthopnea, edema, abdominal pain, nausea, melena, diarrhea, constipation, flank pain, dysuria, hematuria, urinary  Frequency, nocturia, numbness, tingling, seizures,  Focal weakness, Loss of consciousness,  Tremor, insomnia, depression, anxiety, and suicidal ideation.      Objective:  BP 104/80 mmHg  Pulse 70  Ht 5' 4.5" (1.638 m)  Wt 211 lb 6.4 oz (95.89 kg)  BMI 35.74 kg/m2  SpO2 98%  Physical Exam   General appearance: alert, cooperative and appears stated age Head: Normocephalic, without  obvious abnormality, atraumatic Eyes: conjunctivae/corneas clear. PERRL, EOM's intact. Fundi benign. Ears: normal TM's and external ear canals both ears Nose: Nares normal. Septum midline. Mucosa normal. No drainage or sinus tenderness. Throat: lips, mucosa, and tongue normal; teeth and gums normal Neck: no adenopathy, no carotid bruit, no JVD, supple, symmetrical, trachea midline and thyroid not enlarged, symmetric, no tenderness/mass/nodules Lungs: clear to auscultation bilaterally Breasts: normal appearance, no masses or tenderness Heart: regular rate and rhythm, S1, S2 normal, no murmur, click, rub or gallop Abdomen: soft, non-tender; bowel sounds normal; no masses,  no organomegaly Extremities: extremities normal, atraumatic, no cyanosis or edema Pulses: 2+ and symmetric Skin: Skin color, texture, turgor normal. 2 cm patch of edematous/bullous skin with punctate red spots  behind right knee, nondraining    Neurologic: Alert and oriented X 3, normal strength and tone. Normal symmetric reflexes. Normal coordination and gait.     Assessment & Plan:   Problem List Items Addressed This Visit    Obesity (BMI 30-39.9)    I have addressed  BMI and recommended a low glycemic index diet utilizing smaller more frequent meals to increase metabolism.  I have also recommended that patient start exercising with a goal of 30 minutes of aerobic exercise a minimum of 5 days per  week. Screening for lipid disorders, thyroid and diabetes to be done today.  No results found for: HGBA1C Lab Results  Component Value Date   TSH 1.47 08/19/2015   Lab Results  Component Value Date   CHOL 163 08/19/2015   HDL 58.60 08/19/2015   LDLCALC 84 08/19/2015   LDLDIRECT 81.0 08/19/2015   TRIG 101.0 08/19/2015   CHOLHDL 3 08/19/2015           Adenomatous colon polyp    Due in 2019 for next colonoscopy       Visit for preventive health examination    Annual comprehensive preventive exam was done as well  as an evaluation and management of chronic conditions .  During the course of the visit the patient was educated and counseled about appropriate screening and preventive services including :  diabetes screening, lipid analysis with projected  10 year  risk for CAD , nutrition counseling, breast and colorectal cancer screening, and recommended immunizations.  Printed recommendations for health maintenance screenings was given.      Hyperlipidemia with target LDL less than 100    Managed with statin therapy.   LDL is at goal on current medications.  LFTS are normal. Repeat both in 6 months   Lab Results  Component Value Date   CHOL 163 08/19/2015   HDL 58.60 08/19/2015   LDLCALC 84 08/19/2015   LDLDIRECT 81.0 08/19/2015   TRIG 101.0 08/19/2015   CHOLHDL 3 08/19/2015   Lab Results  Component Value Date   ALT 16 08/19/2015   AST 19 08/19/2015   ALKPHOS 34* 08/19/2015   BILITOT 1.0 08/19/2015          Asthma, chronic    Patient is no longer using flovent due to insurance non coverage. She has had no exacerbations requiring use of albuterol rescue inhaler in over 6  months         Relevant Medications   PULMICORT FLEXHALER 180 MCG/ACT inhaler   Skin lesion of right leg - Primary    She is now s/p doxy x 1 week,  Nystatin x 2 weeks, and area is still pruritic and swollen.  Steroid  Cream prescribed       Vitamin D deficiency   Relevant Orders   VITAMIN D 25 Hydroxy (Vit-D Deficiency, Fractures) (Completed)    Other Visit Diagnoses    Hyperlipidemia        Relevant Orders    Lipid panel (Completed)    LDL cholesterol, direct (Completed)    Other fatigue        Relevant Orders    Comprehensive metabolic panel (Completed)    Hepatitis C antibody (Completed)    HIV antibody (Completed)    TSH (Completed)    CBC with Differential/Platelet (Completed)    Breast cancer screening        Relevant Orders    MM DIGITAL SCREENING BILATERAL       I have discontinued Ms.  Westerman's VENTOLIN HFA and doxycycline. I am also having her maintain her RA ALLERGY RELIEF, fluticasone, CRESTOR, hydrochlorothiazide, pantoprazole, nystatin ointment, and PULMICORT FLEXHALER.  Meds ordered this encounter  Medications  . PULMICORT FLEXHALER 180 MCG/ACT inhaler    Sig: Inhale 180 mcg into the lungs as needed.    Refill:  1  . DISCONTD: Clobetasol Propionate (TEMOVATE) 0.05 % external spray    Sig: Apply topically 2 (two) times daily.    Dispense:  59 mL    Refill:  0  Medications Discontinued During This Encounter  Medication Reason  . doxycycline (VIBRA-TABS) 100 MG tablet Completed Course  . VENTOLIN HFA 108 (90 BASE) MCG/ACT inhaler Change in therapy    Follow-up: No Follow-up on file.   Crecencio Mc, MD

## 2015-08-20 ENCOUNTER — Encounter: Payer: Self-pay | Admitting: Internal Medicine

## 2015-08-20 LAB — HEPATITIS C ANTIBODY: HCV Ab: NEGATIVE

## 2015-08-20 LAB — HIV ANTIBODY (ROUTINE TESTING W REFLEX): HIV 1&2 Ab, 4th Generation: NONREACTIVE

## 2015-08-21 MED ORDER — TRIAMCINOLONE ACETONIDE 0.1 % EX CREA: 1.0000 "application " | TOPICAL_CREAM | Freq: Two times a day (BID) | CUTANEOUS | Status: DC

## 2015-08-22 ENCOUNTER — Other Ambulatory Visit: Payer: Self-pay | Admitting: Internal Medicine

## 2015-08-22 ENCOUNTER — Encounter: Payer: Self-pay | Admitting: Internal Medicine

## 2015-08-22 DIAGNOSIS — D509 Iron deficiency anemia, unspecified: Secondary | ICD-10-CM | POA: Insufficient documentation

## 2015-08-22 MED ORDER — ERGOCALCIFEROL 1.25 MG (50000 UT) PO CAPS: 50000.0000 [IU] | ORAL_CAPSULE | ORAL | Status: DC

## 2015-08-22 NOTE — Assessment & Plan Note (Signed)
Annual comprehensive preventive exam was done as well as an evaluation and management of chronic conditions .  During the course of the visit the patient was educated and counseled about appropriate screening and preventive services including :  diabetes screening, lipid analysis with projected  10 year  risk for CAD , nutrition counseling, breast and colorectal cancer screening, and recommended immunizations.  Printed recommendations for health maintenance screenings was given.

## 2015-08-22 NOTE — Assessment & Plan Note (Signed)
Due in 2019 for next colonoscopy

## 2015-08-22 NOTE — Assessment & Plan Note (Signed)
Managed with statin therapy.   LDL is at goal on current medications.  LFTS are normal. Repeat both in 6 months   Lab Results  Component Value Date   CHOL 163 08/19/2015   HDL 58.60 08/19/2015   LDLCALC 84 08/19/2015   LDLDIRECT 81.0 08/19/2015   TRIG 101.0 08/19/2015   CHOLHDL 3 08/19/2015   Lab Results  Component Value Date   ALT 16 08/19/2015   AST 19 08/19/2015   ALKPHOS 34* 08/19/2015   BILITOT 1.0 08/19/2015

## 2015-08-22 NOTE — Assessment & Plan Note (Signed)
I have addressed  BMI and recommended a low glycemic index diet utilizing smaller more frequent meals to increase metabolism.  I have also recommended that patient start exercising with a goal of 30 minutes of aerobic exercise a minimum of 5 days per week. Screening for lipid disorders, thyroid and diabetes to be done today.  No results found for: HGBA1C Lab Results  Component Value Date   TSH 1.47 08/19/2015   Lab Results  Component Value Date   CHOL 163 08/19/2015   HDL 58.60 08/19/2015   LDLCALC 84 08/19/2015   LDLDIRECT 81.0 08/19/2015   TRIG 101.0 08/19/2015   CHOLHDL 3 08/19/2015

## 2015-08-22 NOTE — Assessment & Plan Note (Signed)
Patient is no longer using flovent due to insurance non coverage. She has had no exacerbations requiring use of albuterol rescue inhaler in over 6  months  

## 2015-08-24 ENCOUNTER — Other Ambulatory Visit: Payer: Self-pay

## 2015-08-24 MED ORDER — HYDROCHLOROTHIAZIDE 25 MG PO TABS: 25.0000 mg | ORAL_TABLET | Freq: Every day | ORAL | Status: DC

## 2015-08-24 MED ORDER — PANTOPRAZOLE SODIUM 40 MG PO TBEC: 40.0000 mg | DELAYED_RELEASE_TABLET | Freq: Every day | ORAL | Status: DC

## 2015-08-26 ENCOUNTER — Other Ambulatory Visit: Payer: Self-pay | Admitting: Internal Medicine

## 2015-09-23 LAB — HM MAMMOGRAPHY

## 2016-02-18 ENCOUNTER — Other Ambulatory Visit: Payer: Self-pay | Admitting: Internal Medicine

## 2016-07-18 ENCOUNTER — Other Ambulatory Visit: Payer: Self-pay | Admitting: Internal Medicine

## 2016-08-18 ENCOUNTER — Other Ambulatory Visit: Payer: Self-pay | Admitting: Internal Medicine

## 2016-08-18 NOTE — Telephone Encounter (Signed)
DONE

## 2016-08-18 NOTE — Telephone Encounter (Signed)
Has an appointment scheduled for 08/23/16 ok to fill for 30 days?

## 2016-08-23 ENCOUNTER — Ambulatory Visit (INDEPENDENT_AMBULATORY_CARE_PROVIDER_SITE_OTHER): Payer: Managed Care, Other (non HMO) | Admitting: Internal Medicine

## 2016-08-23 ENCOUNTER — Encounter: Payer: Self-pay | Admitting: Internal Medicine

## 2016-08-23 VITALS — BP 104/76 | HR 75 | Temp 98.1°F | Resp 15 | Ht 64.5 in | Wt 214.6 lb

## 2016-08-23 DIAGNOSIS — E669 Obesity, unspecified: Secondary | ICD-10-CM

## 2016-08-23 DIAGNOSIS — Z23 Encounter for immunization: Secondary | ICD-10-CM

## 2016-08-23 DIAGNOSIS — Z0000 Encounter for general adult medical examination without abnormal findings: Secondary | ICD-10-CM

## 2016-08-23 DIAGNOSIS — E785 Hyperlipidemia, unspecified: Secondary | ICD-10-CM | POA: Diagnosis not present

## 2016-08-23 DIAGNOSIS — Z1231 Encounter for screening mammogram for malignant neoplasm of breast: Secondary | ICD-10-CM

## 2016-08-23 DIAGNOSIS — E7800 Pure hypercholesterolemia, unspecified: Secondary | ICD-10-CM

## 2016-08-23 DIAGNOSIS — Z1239 Encounter for other screening for malignant neoplasm of breast: Secondary | ICD-10-CM

## 2016-08-23 DIAGNOSIS — G4733 Obstructive sleep apnea (adult) (pediatric): Secondary | ICD-10-CM

## 2016-08-23 DIAGNOSIS — N289 Disorder of kidney and ureter, unspecified: Secondary | ICD-10-CM

## 2016-08-23 LAB — COMPREHENSIVE METABOLIC PANEL
ALBUMIN: 4.2 g/dL (ref 3.5–5.2)
ALK PHOS: 32 U/L — AB (ref 39–117)
ALT: 15 U/L (ref 0–35)
AST: 16 U/L (ref 0–37)
BILIRUBIN TOTAL: 0.9 mg/dL (ref 0.2–1.2)
BUN: 20 mg/dL (ref 6–23)
CALCIUM: 9.9 mg/dL (ref 8.4–10.5)
CO2: 33 meq/L — AB (ref 19–32)
CREATININE: 1.03 mg/dL (ref 0.40–1.20)
Chloride: 102 mEq/L (ref 96–112)
GFR: 57.15 mL/min — AB (ref >60.00)
Glucose, Bld: 96 mg/dL (ref 70–99)
Potassium: 3.5 mEq/L (ref 3.5–5.1)
Sodium: 141 mEq/L (ref 135–145)
TOTAL PROTEIN: 7.2 g/dL (ref 6.0–8.3)

## 2016-08-23 LAB — LIPID PANEL
CHOL/HDL RATIO: 3
CHOLESTEROL: 165 mg/dL (ref 0–200)
HDL: 65.9 mg/dL (ref >39.00)
LDL Cholesterol: 85 mg/dL (ref 0–99)
NonHDL: 99.18
TRIGLYCERIDES: 70 mg/dL (ref 0.0–149.0)
VLDL: 14 mg/dL (ref 0.0–40.0)

## 2016-08-23 LAB — TSH: TSH: 1.47 u[IU]/mL (ref 0.35–4.50)

## 2016-08-23 LAB — HEMOGLOBIN A1C: HEMOGLOBIN A1C: 6.1 % (ref 4.6–6.5)

## 2016-08-23 NOTE — Assessment & Plan Note (Signed)
Annual comprehensive preventive exam was done as well as an evaluation and management of chronic conditions .  During the course of the visit the patient was educated and counseled about appropriate screening and preventive services including :  diabetes screening, lipid analysis with projected  10 year  risk for CAD , nutrition counseling, breast, cervical and colorectal cancer screening, and recommended immunizations.  Printed recommendations for health maintenance screenings was given 

## 2016-08-23 NOTE — Assessment & Plan Note (Signed)
Repeat cr is normal   Lab Results  Component Value Date   CREATININE 1.03 08/23/2016   Lab Results  Component Value Date   NA 141 08/23/2016   K 3.5 08/23/2016   CL 102 08/23/2016   CO2 33 (H) 08/23/2016

## 2016-08-23 NOTE — Patient Instructions (Signed)
You received the Prevnar vaccine today  I will document your other Pneumonia vaccine that was probably done by Dr Andy Gauss.  You will need to booster for htat one in 2010 or 2020  Colonoscopy is due next in April 2019  The ShingRx vaccine will be available in about 6 months and IS ADVISED for all interested adults over 50 to prevent shingles   I want you to lose 18 lbs over the next six months (slightly less than 10% of yoru current body weight using a low glycemic index diet and 30 MINUTES OF EXERCISE 3 DAYS PER WEEK   Health Maintenance for Postmenopausal Women Menopause is a normal process in which your reproductive ability comes to an end. This process happens gradually over a span of months to years, usually between the ages of 30 and 74. Menopause is complete when you have missed 12 consecutive menstrual periods. It is important to talk with your health care provider about some of the most common conditions that affect postmenopausal women, such as heart disease, cancer, and bone loss (osteoporosis). Adopting a healthy lifestyle and getting preventive care can help to promote your health and wellness. Those actions can also lower your chances of developing some of these common conditions. What should I know about menopause? During menopause, you may experience a number of symptoms, such as:  Moderate-to-severe hot flashes.  Night sweats.  Decrease in sex drive.  Mood swings.  Headaches.  Tiredness.  Irritability.  Memory problems.  Insomnia.  Choosing to treat or not to treat menopausal changes is an individual decision that you make with your health care provider. What should I know about hormone replacement therapy and supplements? Hormone therapy products are effective for treating symptoms that are associated with menopause, such as hot flashes and night sweats. Hormone replacement carries certain risks, especially as you become older. If you are thinking about using  estrogen or estrogen with progestin treatments, discuss the benefits and risks with your health care provider. What should I know about heart disease and stroke? Heart disease, heart attack, and stroke become more likely as you age. This may be due, in part, to the hormonal changes that your body experiences during menopause. These can affect how your body processes dietary fats, triglycerides, and cholesterol. Heart attack and stroke are both medical emergencies. There are many things that you can do to help prevent heart disease and stroke:  Have your blood pressure checked at least every 1-2 years. High blood pressure causes heart disease and increases the risk of stroke.  If you are 65 years old, ask your health care provider if you should take aspirin to prevent a heart attack or a stroke.  Do not use any tobacco products, including cigarettes, chewing tobacco, or electronic cigarettes. If you need help quitting, ask your health care provider.  It is important to eat a healthy diet and maintain a healthy weight. ? Be sure to include plenty of vegetables, fruits, low-fat dairy products, and lean protein. ? Avoid eating foods that are high in solid fats, added sugars, or salt (sodium).  Get regular exercise. This is one of the most important things that you can do for your health. ? Try to exercise for at least 150 minutes each week. The type of exercise that you do should increase your heart rate and make you sweat. This is known as moderate-intensity exercise. ? Try to do strengthening exercises at least twice each week. Do these in addition to the moderate-intensity  exercise.  Know your numbers.Ask your health care provider to check your cholesterol and your blood glucose. Continue to have your blood tested as directed by your health care provider.  What should I know about cancer screening? There are several types of cancer. Take the following steps to reduce your risk and to catch  any cancer development as early as possible. Breast Cancer  Practice breast self-awareness. ? This means understanding how your breasts normally appear and feel. ? It also means doing regular breast self-exams. Let your health care provider know about any changes, no matter how small.  If you are 65 or older, have a clinician do a breast exam (clinical breast exam or CBE) every year. Depending on your age, family history, and medical history, it may be recommended that you also have a yearly breast X-ray (mammogram).  If you have a family history of breast cancer, talk with your health care provider about genetic screening.  If you are at high risk for breast cancer, talk with your health care provider about having an MRI and a mammogram every year.  Breast cancer (BRCA) gene test is recommended for women who have family members with BRCA-related cancers. Results of the assessment will determine the need for genetic counseling and BRCA1 and for BRCA2 testing. BRCA-related cancers include these types: ? Breast. This occurs in males or females. ? Ovarian. ? Tubal. This may also be called fallopian tube cancer. ? Cancer of the abdominal or pelvic lining (peritoneal cancer). ? Prostate. ? Pancreatic.  Cervical, Uterine, and Ovarian Cancer Your health care provider may recommend that you be screened regularly for cancer of the pelvic organs. These include your ovaries, uterus, and vagina. This screening involves a pelvic exam, which includes checking for microscopic changes to the surface of your cervix (Pap test).  For women ages 65-65, health care providers may recommend a pelvic exam and a Pap test every three years. For women ages 65-65, they may recommend the Pap test and pelvic exam, combined with testing for human papilloma virus (HPV), every five years. Some types of HPV increase your risk of cervical cancer. Testing for HPV may also be done on women of any age who have unclear Pap test  results.  Other health care providers may not recommend any screening for nonpregnant women who are considered low risk for pelvic cancer and have no symptoms. Ask your health care provider if a screening pelvic exam is right for you.  If you have had past treatment for cervical cancer or a condition that could lead to cancer, you need Pap tests and screening for cancer for at least 20 years after your treatment. If Pap tests have been discontinued for you, your risk factors (such as having a new sexual partner) need to be reassessed to determine if you should start having screenings again. Some women have medical problems that increase the chance of getting cervical cancer. In these cases, your health care provider may recommend that you have screening and Pap tests more often.  If you have a family history of uterine cancer or ovarian cancer, talk with your health care provider about genetic screening.  If you have vaginal bleeding after reaching menopause, tell your health care provider.  There are currently no reliable tests available to screen for ovarian cancer.  Lung Cancer Lung cancer screening is recommended for adults 69-45 years old who are at high risk for lung cancer because of a history of smoking. A yearly low-dose CT scan  of the lungs is recommended if you:  Currently smoke.  Have a history of at least 30 pack-years of smoking and you currently smoke or have quit within the past 15 years. A pack-year is smoking an average of one pack of cigarettes per day for one year.  Yearly screening should:  Continue until it has been 15 years since you quit.  Stop if you develop a health problem that would prevent you from having lung cancer treatment.  Colorectal Cancer  This type of cancer can be detected and can often be prevented.  Routine colorectal cancer screening usually begins at age 31 and continues through age 1.  If you have risk factors for colon cancer, your health  care provider may recommend that you be screened at an earlier age.  If you have a family history of colorectal cancer, talk with your health care provider about genetic screening.  Your health care provider may also recommend using home test kits to check for hidden blood in your stool.  A small camera at the end of a tube can be used to examine your colon directly (sigmoidoscopy or colonoscopy). This is done to check for the earliest forms of colorectal cancer.  Direct examination of the colon should be repeated every 5-10 years until age 17. However, if early forms of precancerous polyps or small growths are found or if you have a family history or genetic risk for colorectal cancer, you may need to be screened more often.  Skin Cancer  Check your skin from head to toe regularly.  Monitor any moles. Be sure to tell your health care provider: ? About any new moles or changes in moles, especially if there is a change in a mole's shape or color. ? If you have a mole that is larger than the size of a pencil eraser.  If any of your family members has a history of skin cancer, especially at a young age, talk with your health care provider about genetic screening.  Always use sunscreen. Apply sunscreen liberally and repeatedly throughout the day.  Whenever you are outside, protect yourself by wearing long sleeves, pants, a wide-brimmed hat, and sunglasses.  What should I know about osteoporosis? Osteoporosis is a condition in which bone destruction happens more quickly than new bone creation. After menopause, you may be at an increased risk for osteoporosis. To help prevent osteoporosis or the bone fractures that can happen because of osteoporosis, the following is recommended:  If you are 57-66 years old, get at least 1,000 mg of calcium and at least 600 mg of vitamin D per day.  If you are older than age 57 but younger than age 70, get at least 1,200 mg of calcium and at least 600 mg of  vitamin D per day.  If you are older than age 50, get at least 1,200 mg of calcium and at least 800 mg of vitamin D per day.  Smoking and excessive alcohol intake increase the risk of osteoporosis. Eat foods that are rich in calcium and vitamin D, and do weight-bearing exercises several times each week as directed by your health care provider. What should I know about how menopause affects my mental health? Depression may occur at any age, but it is more common as you become older. Common symptoms of depression include:  Low or sad mood.  Changes in sleep patterns.  Changes in appetite or eating patterns.  Feeling an overall lack of motivation or enjoyment of activities that  you previously enjoyed.  Frequent crying spells.  Talk with your health care provider if you think that you are experiencing depression. What should I know about immunizations? It is important that you get and maintain your immunizations. These include:  Tetanus, diphtheria, and pertussis (Tdap) booster vaccine.  Influenza every year before the flu season begins.  Pneumonia vaccine.  Shingles vaccine.  Your health care provider may also recommend other immunizations. This information is not intended to replace advice given to you by your health care provider. Make sure you discuss any questions you have with your health care provider. Document Released: 04/28/2005 Document Revised: 09/24/2015 Document Reviewed: 12/08/2014 Elsevier Interactive Patient Education  2018 [employer].

## 2016-08-23 NOTE — Assessment & Plan Note (Signed)
diagnosed with prior sleep study but treatment has been deferred d by patient.  Discussed the long term history of OSA , the risks of long term damage to heart and the signs and symptoms attributable to OSA.  Advised patient to consider  significant weight loss and/or use of CPAP.  

## 2016-08-23 NOTE — Assessment & Plan Note (Signed)
I have addressed  BMI and recommended a low glycemic index diet utilizing smaller more frequent meals to increase metabolism.  I have also recommended that patient start exercising with a goal of 30 minutes of aerobic exercise a minimum of 5 days per week. Advised to lose 18 lbs by follow up in 6 months

## 2016-08-23 NOTE — Progress Notes (Signed)
Patient ID: Donna Richards, female    DOB: [birth year]  Age: 66 y.o. MRN: [id number]  The patient is here for  wellness examination and management of other chronic and acute problems.  Health Maintenance: Tubular  adenoma April 2014.  Vira Agar,  DUE NEXT YEAR Mammogram  July 2017     The risk factors are reflected in the social history.  The roster of all physicians providing medical care to patient - is listed in the Snapshot section of the chart.  Activities of daily living:  The patient is 100% independent in all ADLs: dressing, toileting, feeding as well as independent mobility  Home safety : The patient has smoke detectors in the home. They wear seatbelts.  There are no firearms at home. There is no violence in the home.   There is no risks for hepatitis, STDs or HIV. There is no   history of blood transfusion. They have no travel history to infectious disease endemic areas of the world.  The patient has seen their dentist in the last six month. They have seen their eye doctor in the last year. They admit to slight hearing difficulty with regard to whispered voices and some television programs.  They have deferred audiologic testing in the last year.  They do not  have excessive sun exposure. Discussed the need for sun protection: hats, long sleeves and use of sunscreen if there is significant sun exposure.   Diet: the importance of a healthy diet is discussed. They do have a healthy diet.  The benefits of regular aerobic exercise were discussed. She IS NOT EXERCISING .   Depression screen: there are no signs or vegative symptoms of depression- irritability, change in appetite, anhedonia, sadness/tearfullness.  Cognitive assessment: the patient manages all their financial and personal affairs and is actively engaged. They could relate day,date,year and events; recalled 2/3 objects at 3 minutes; performed clock-face test normally.  The following portions of the patient's history were  reviewed and updated as appropriate: allergies, current medications, past family history, past medical history,  past surgical history, past social history  and problem list.  Visual acuity was not assessed per patient preference since she has regular follow up with Donna Richards ophthalmologist. Hearing and body mass index were assessed and reviewed.   During the course of the visit the patient was educated and counseled about appropriate screening and preventive services including : fall prevention , diabetes screening, nutrition counseling, colorectal cancer screening, and recommended immunizations.    CC: The primary encounter diagnosis was Screening breast examination. Diagnoses of Pure hypercholesterolemia, Obesity (BMI 35.0-39.9 without comorbidity), Need for vaccination with 13-polyvalent pneumococcal conjugate vaccine, Breast cancer screening, Obesity (BMI 30-39.9), Hyperlipidemia with target LDL less than 100, Renal insufficiency, mild, OSA (obstructive sleep apnea), and Visit for preventive health examination were also pertinent to this visit.  Stress: EX husband diagnosed with an abdominal cancer affecting his colon, has been takig care of him .  Has several wakeups due to bladder. Snoring,  Has OSA but does not use the CPAP (sees [place])   Not exercising,  Cites history of back pain from a year ago as a valid reason athough no longer present .  "I used to be addicted to exercise."     itchy skin lesion on left calf.   Present for a month,  Does not look like cancer   Has not been seen in a year; takes crestor,  hctz and protonix.  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   She has a past surgical history that includes Bilateral oophorectomy and Abdominal  hysterectomy (1996).   Donna Richards family history includes COPD in Donna Richards father; Diabetes in Donna Richards maternal grandmother and mother; Heart disease in Donna Richards father, maternal grandfather, and mother; Hyperlipidemia in Donna Richards mother; Kidney disease in Donna Richards mother; Stroke in Donna Richards paternal grandmother.She reports that she has never smoked. She has never used smokeless tobacco. She reports that she does not drink alcohol or use drugs.  Outpatient Medications Prior to Visit  Medication Sig Dispense Refill  . fluticasone (FLONASE) 50 MCG/ACT nasal spray instill 1 spray into each nostril twice a day 16 g 5  . hydrochlorothiazide (HYDRODIURIL) 25 MG tablet take 1 tablet by mouth once daily 30 tablet 0  . pantoprazole (PROTONIX) 40 MG tablet take 1 tablet by mouth once daily 30 tablet 0  . PULMICORT FLEXHALER 180 MCG/ACT inhaler Inhale 180 mcg into the lungs as needed.  1  . RA ALLERGY RELIEF 180 MG tablet take 1 tablet by mouth once daily 30 tablet 11  . rosuvastatin (CRESTOR) 5 MG tablet take 1 tablet by mouth once daily 30 tablet 5  . nystatin ointment (MYCOSTATIN) Apply 1 application topically 2 (two) times daily. Apply thin layer twice daily on areas for 2 weeks (Patient not taking: Reported on 08/23/2016) 30 g 0  . triamcinolone cream (KENALOG) 0.1 % Apply 1 application topically 2 (two) times daily. (Patient not taking: Reported on 08/23/2016) 30 g 0  . ergocalciferol (DRISDOL) 50000 units capsule Take 1 capsule (50,000 Units total) by mouth once a week. (Patient not taking: Reported on 08/23/2016) 12 capsule 0   No facility-administered medications prior to visit.     Review of Systems   Patient denies headache, fevers, malaise, unintentional weight loss, skin rash, eye pain, sinus congestion and sinus pain, sore throat, dysphagia,  hemoptysis , cough, dyspnea, wheezing, chest pain, palpitations, orthopnea, edema, abdominal pain, nausea, melena, diarrhea, constipation, flank pain, dysuria, hematuria, urinary  Frequency,  nocturia, numbness, tingling, seizures,  Focal weakness, Loss of consciousness,  Tremor, insomnia, depression, anxiety, and suicidal ideation.      Objective:  BP 104/76 (BP Location: Left Arm, Patient Position: Sitting, Cuff Size: Large)   Pulse 75   Temp 98.1 F (36.7 C) (Oral)   Resp 15   Ht 5' 4.5" (1.638 m)   Wt 214 lb 9.6 oz (97.3 kg)   SpO2 99%   BMI 36.27 kg/m   Physical Exam   General appearance: alert, obese, well groomed  and appears stated age Head: Normocephalic, without obvious abnormality, atraumatic Eyes: conjunctivae/corneas clear. PERRL, EOM's intact. Fundi benign. Ears: normal TM's and external ear canals both ears Nose: Nares normal. Septum midline. Mucosa normal. No drainage or sinus tenderness. Throat: lips, mucosa, and tongue normal; teeth and gums normal Neck: no adenopathy, no carotid bruit, no JVD, supple, symmetrical, trachea midline and thyroid not enlarged, symmetric, no tenderness/mass/nodules Lungs: clear to auscultation bilaterally Breasts: normal appearance, no masses or tenderness Heart: regular rate and rhythm, S1, S2 normal, no murmur, click, rub or gallop Abdomen: soft, non-tender; bowel sounds normal; no masses,  no organomegaly Extremities: extremities normal, atraumatic, no cyanosis or edema Pulses: 2+ and symmetric Skin: Skin color, texture, turgor normal. No rashes or lesions Neurologic: Alert and oriented X 3, normal strength and tone.  Normal symmetric reflexes. Normal coordination and gait.      Assessment & Plan:   Problem List Items Addressed This Visit    Visit for preventive health examination    Annual comprehensive preventive exam was done as well as an evaluation and management of chronic conditions .  During the course of the visit the patient was educated and counseled about appropriate screening and preventive services including :  diabetes screening, lipid analysis with projected  10 year  risk for CAD , nutrition  counseling, breast, cervical and colorectal cancer screening, and recommended immunizations.  Printed recommendations for health maintenance screenings was given      Renal insufficiency, mild    Repeat cr is normal   Lab Results  Component Value Date   CREATININE 1.03 08/23/2016   Lab Results  Component Value Date   NA 141 08/23/2016   K 3.5 08/23/2016   CL 102 08/23/2016   CO2 33 (H) 08/23/2016         OSA (obstructive sleep apnea)    diagnosed with prior sleep study but treatment has been deferred d by patient.  Discussed the long term history of OSA , the risks of long term damage to heart and the signs and symptoms attributable to OSA.  Advised patient to consider  significant weight loss and/or use of CPAP.       Obesity (BMI 30-39.9)    I have addressed  BMI and recommended a low glycemic index diet utilizing smaller more frequent meals to increase metabolism.  I have also recommended that patient start exercising with a goal of 30 minutes of aerobic exercise a minimum of 5 days per week. Advised to lose 18 lbs by follow up in 6 months       Hyperlipidemia with target LDL less than 100    Managed with statin therapy.   LDL is at goal on current medications.  LFTS are normal. Repeat both in 6 months   Lab Results  Component Value Date   CHOL 165 08/23/2016   HDL 65.90 08/23/2016   LDLCALC 85 08/23/2016   LDLDIRECT 81.0 08/19/2015   TRIG 70.0 08/23/2016   CHOLHDL 3 08/23/2016   Lab Results  Component Value Date   ALT 15 08/23/2016   AST 16 08/23/2016   ALKPHOS 32 (L) 08/23/2016   BILITOT 0.9 08/23/2016           Other Visit Diagnoses    Screening breast examination    -  Primary   Pure hypercholesterolemia       Relevant Orders   Lipid panel (Completed)   Obesity (BMI 35.0-39.9 without comorbidity)       Relevant Orders   Comprehensive metabolic panel (Completed)   Hemoglobin A1c (Completed)   TSH (Completed)   Need for vaccination with 13-polyvalent  pneumococcal conjugate vaccine       Relevant Orders   Pneumococcal conjugate vaccine 13-valent (Completed)   Breast cancer screening       Relevant Orders   MM DIGITAL SCREENING BILATERAL      I have discontinued Ms. Wrage's ergocalciferol. I am also having Donna Richards maintain Donna Richards RA ALLERGY RELIEF, fluticasone, nystatin ointment, PULMICORT FLEXHALER, triamcinolone cream, rosuvastatin, pantoprazole, hydrochlorothiazide, and albuterol.  Meds ordered this encounter  Medications  . albuterol (PROVENTIL HFA;VENTOLIN HFA) 108 (90 Base) MCG/ACT inhaler    Sig: Inhale into the lungs.    Medications Discontinued During This Encounter  Medication Reason  . ergocalciferol (DRISDOL) 50000 units capsule Therapy completed  Follow-up: Return in about 6 months (around 02/22/2017), or FOLLOW UP HYPERTENSION OBESITY .   Crecencio Mc, MD

## 2016-08-23 NOTE — Assessment & Plan Note (Signed)
Managed with statin therapy.   LDL is at goal on current medications.  LFTS are normal. Repeat both in 6 months   Lab Results  Component Value Date   CHOL 165 08/23/2016   HDL 65.90 08/23/2016   LDLCALC 85 08/23/2016   LDLDIRECT 81.0 08/19/2015   TRIG 70.0 08/23/2016   CHOLHDL 3 08/23/2016   Lab Results  Component Value Date   ALT 15 08/23/2016   AST 16 08/23/2016   ALKPHOS 32 (L) 08/23/2016   BILITOT 0.9 08/23/2016

## 2016-08-24 ENCOUNTER — Encounter: Payer: Self-pay | Admitting: Internal Medicine

## 2016-09-17 ENCOUNTER — Other Ambulatory Visit: Payer: Self-pay | Admitting: Internal Medicine

## 2016-09-21 IMAGING — CT CT ABD-PELV W/ CM
2 of 5 series · 15 of 46 positions shown, 17 images · IV contrast (omnipaque)
Comparison: None.

CLINICAL DATA: Left side abdominal pain, lower abdominal pain
starting [REDACTED], nausea and vomiting for 3 days

EXAM:
CT ABDOMEN AND PELVIS WITH CONTRAST
TECHNIQUE: Multidetector CT imaging of the abdomen and pelvis was performed
using the standard protocol following bolus administration of
intravenous contrast.
CONTRAST:  100mL OMNIPAQUE IOHEXOL 300 MG/ML  SOLN

[Series 2: routine abd pel with · axial · 0.88mm/px · z∈[-903,-478]mm · 12 of 96 slices shown, 14 images]
[im 6/96  soft-tissue]
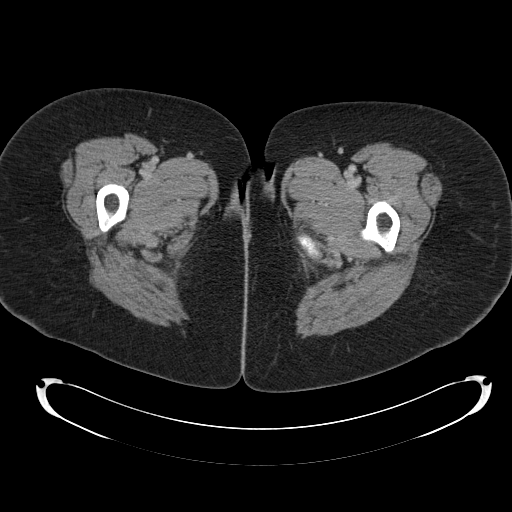
[im 6/96  bone]
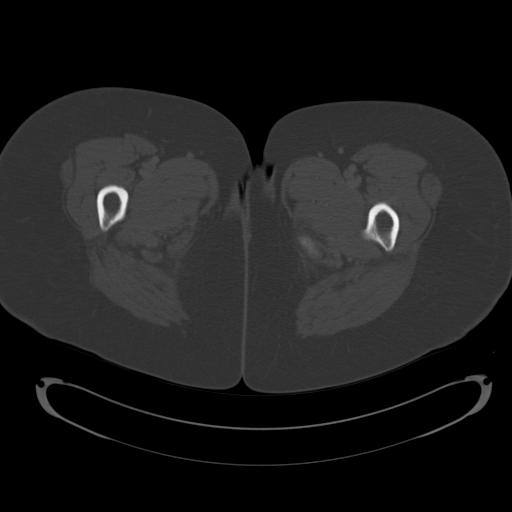
[im 16/96  soft-tissue]
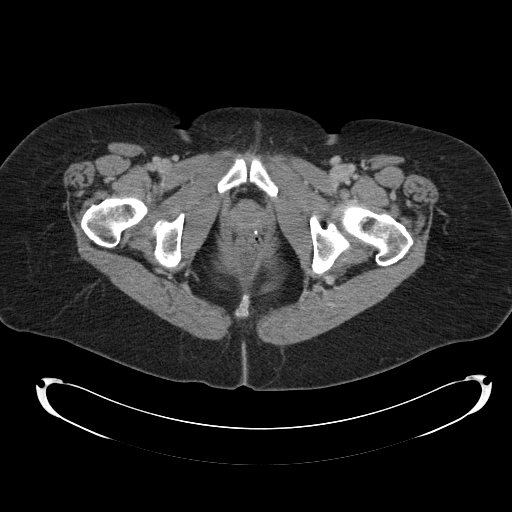
[im 21/96  soft-tissue]
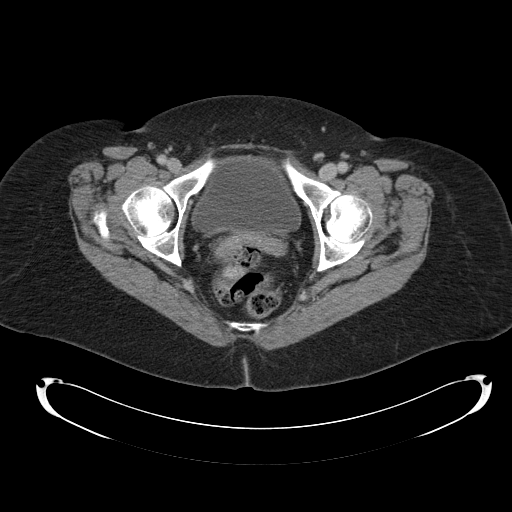
[im 31/96  soft-tissue]
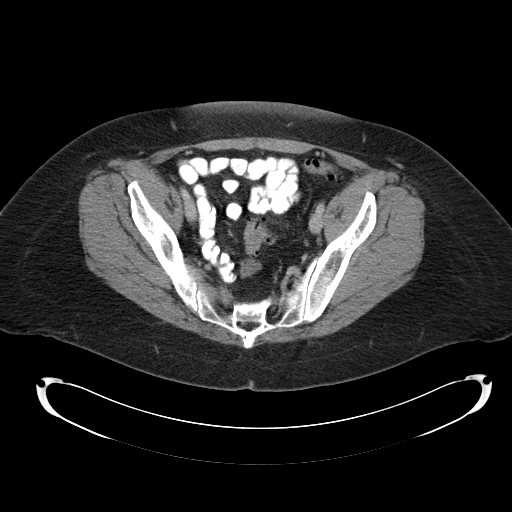
[im 36/96  soft-tissue]
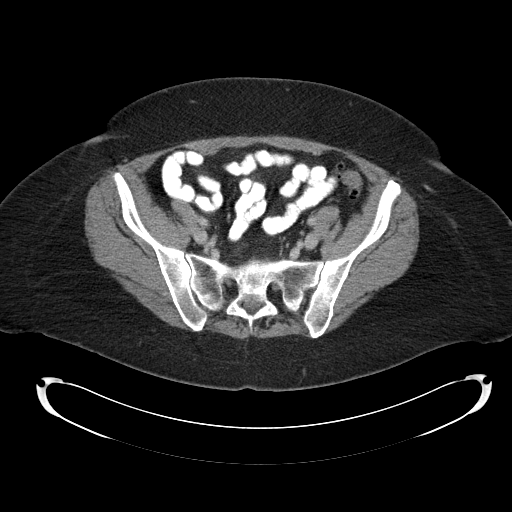
[im 46/96  soft-tissue]
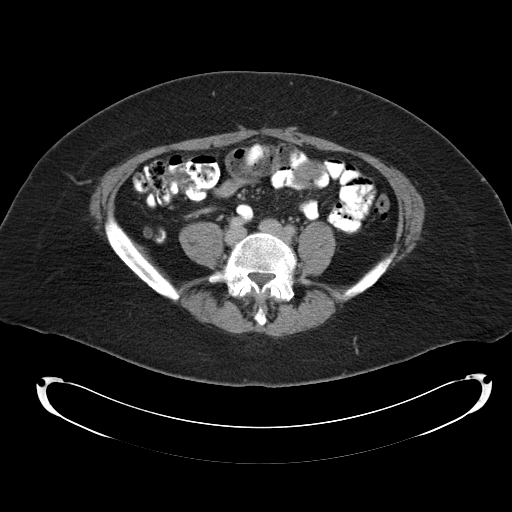
[im 51/96  soft-tissue]
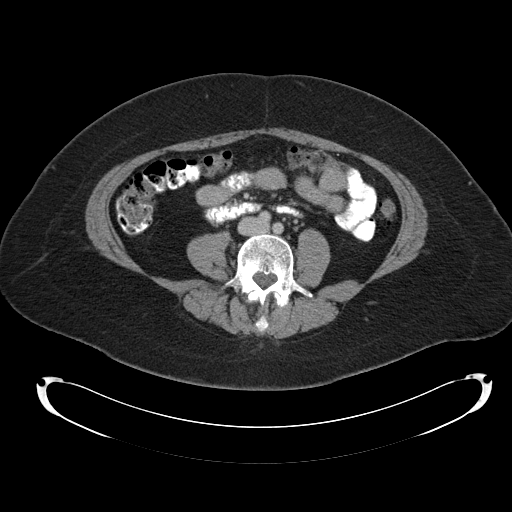
[im 61/96  soft-tissue]
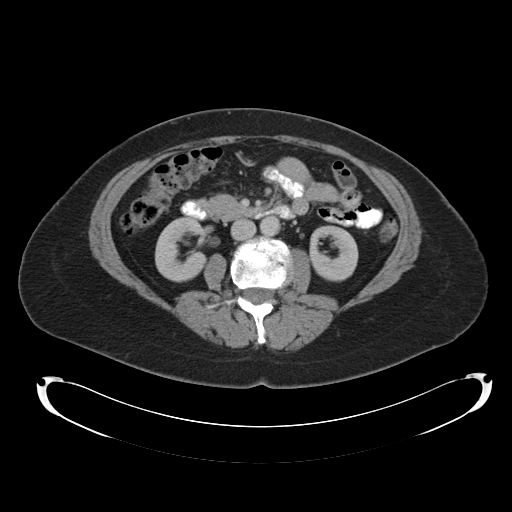
[im 66/96  soft-tissue]
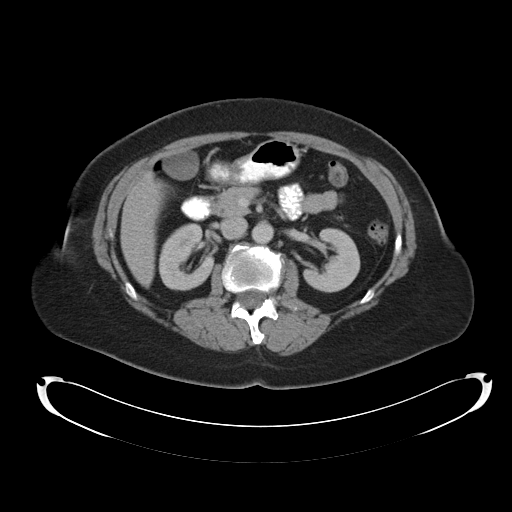
[im 66/96  bone]
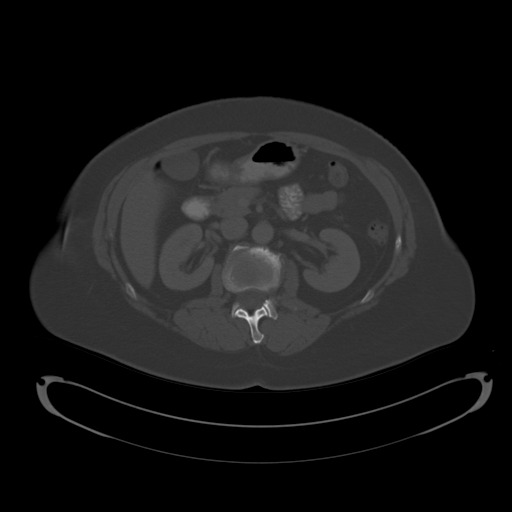
[im 76/96  soft-tissue]
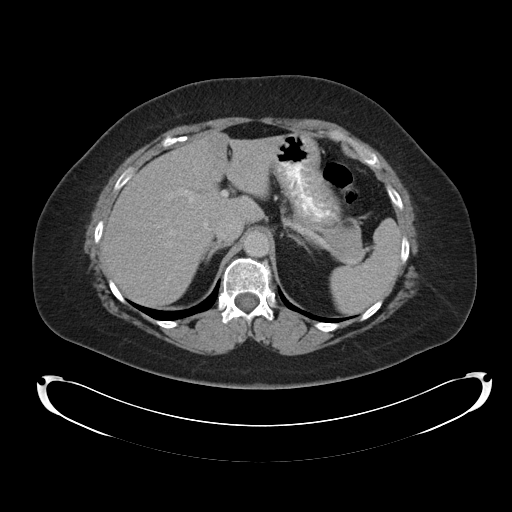
[im 81/96  soft-tissue]
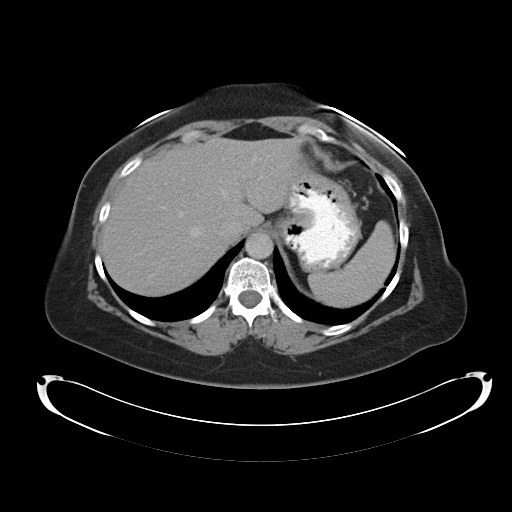
[im 91/96  soft-tissue]
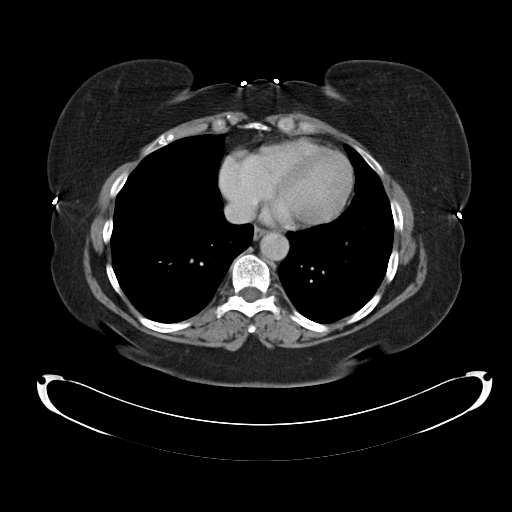

[Series 5: cor routine abd pel with · coronal · 0.77mm/px · 3 of 132 slices shown]
[im 44/132  soft-tissue]
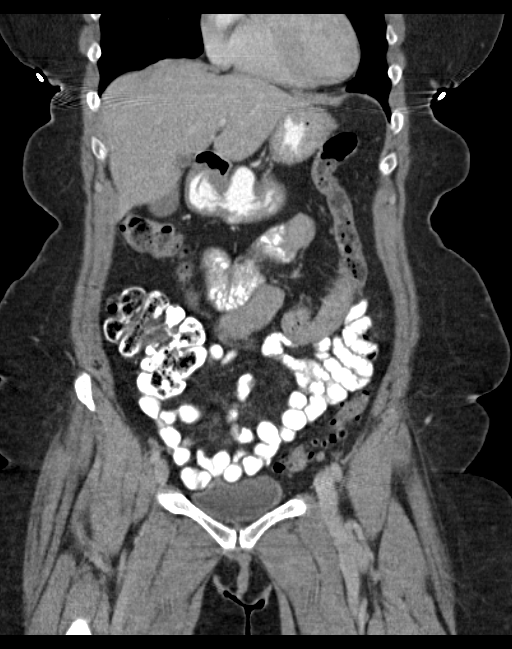
[im 59/132  soft-tissue]
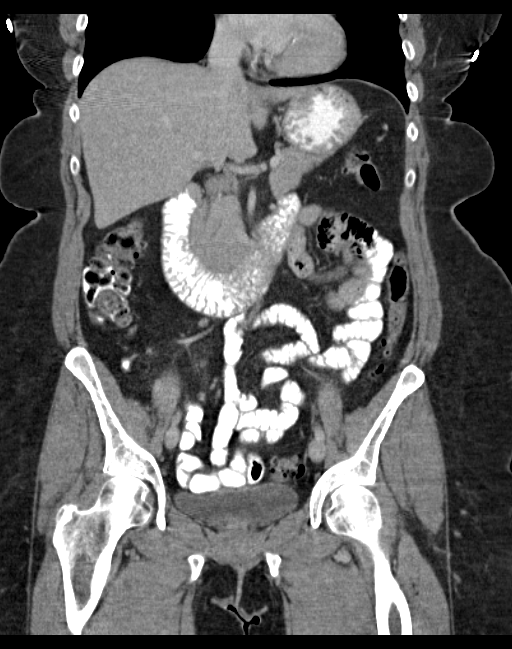
[im 73/132  soft-tissue]
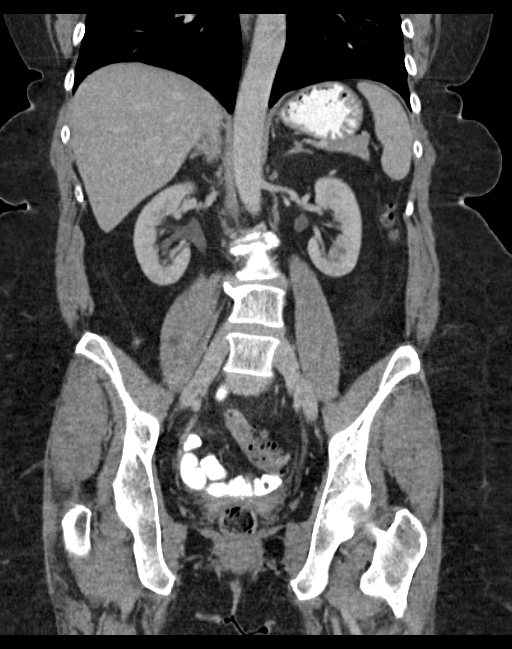

[15 of 46 positions shown; findings below may reference images not displayed]

FINDINGS: The lung bases are unremarkable.

There is levoscoliosis of the lumbar spine. Degenerative changes
lower thoracic and lumbar spine. Enhanced liver shows no biliary
ductal dilatation. There is a small cyst in medial segment of left
hepatic lobe measures 6 mm.

No calcified gallstones are noted within gallbladder. The pancreas,
spleen and adrenal glands are unremarkable.

No aortic aneurysm. No small bowel obstruction. No ascites or free
air. No adenopathy. Normal appendix. No pericecal inflammation. In
axial image 48 there is short segment thickening of small bowel wall
in left mid abdomen. This is confirmed in coronal image 49. Findings
are suspicious for short segment enteritis. There is no evidence of
mesenteric fluid collection. No mesenteric abscess.

Kidneys are symmetrical in size and enhancement. No hydronephrosis
or hydroureter.

Delayed renal images shows bilateral renal symmetrical excretion.
Bilateral visualized proximal ureter is unremarkable.

Descending colon diverticula are noted. No evidence of acute
diverticulitis. Multiple sigmoid colon diverticula. No evidence of
acute diverticulitis. The patient is status post hysterectomy. The
urinary bladder is unremarkable. Some stool noted in distal sigmoid
colon and rectum.
IMPRESSION: 1. There is subtle short segment thickening of small bowel wall in
left mid abdomen. Best seen in axial image 48 and coronal image 49.
Findings are suspicious for mild segmental enteritis. There is no
evidence of adjacent mesenteric abscess or fluid collection. No
perforation.
2. Normal appendix. No small bowel obstruction. No colonic
obstruction. No pericecal inflammation.
3. Descending colon and sigmoid colon diverticula. No evidence of
acute diverticulitis.
4. Status post hysterectomy.
5. No hydronephrosis or hydroureter.

## 2016-09-26 LAB — HM MAMMOGRAPHY

## 2016-10-16 ENCOUNTER — Other Ambulatory Visit: Payer: Self-pay | Admitting: Internal Medicine

## 2016-11-16 ENCOUNTER — Other Ambulatory Visit: Payer: Self-pay | Admitting: Internal Medicine

## 2016-12-29 ENCOUNTER — Encounter: Payer: Self-pay | Admitting: Internal Medicine

## 2017-01-02 ENCOUNTER — Ambulatory Visit (INDEPENDENT_AMBULATORY_CARE_PROVIDER_SITE_OTHER): Payer: Managed Care, Other (non HMO)

## 2017-01-02 DIAGNOSIS — Z23 Encounter for immunization: Secondary | ICD-10-CM

## 2017-02-13 ENCOUNTER — Other Ambulatory Visit: Payer: Self-pay | Admitting: Internal Medicine

## 2017-02-22 ENCOUNTER — Ambulatory Visit: Payer: Managed Care, Other (non HMO) | Admitting: Internal Medicine

## 2017-02-22 ENCOUNTER — Encounter: Payer: Self-pay | Admitting: Internal Medicine

## 2017-02-22 VITALS — BP 104/78 | HR 64 | Temp 97.7°F | Resp 15 | Ht 64.5 in | Wt 216.0 lb

## 2017-02-22 DIAGNOSIS — R7301 Impaired fasting glucose: Secondary | ICD-10-CM

## 2017-02-22 DIAGNOSIS — Z79899 Other long term (current) drug therapy: Secondary | ICD-10-CM

## 2017-02-22 DIAGNOSIS — E785 Hyperlipidemia, unspecified: Secondary | ICD-10-CM | POA: Diagnosis not present

## 2017-02-22 DIAGNOSIS — E538 Deficiency of other specified B group vitamins: Secondary | ICD-10-CM

## 2017-02-22 DIAGNOSIS — R7303 Prediabetes: Secondary | ICD-10-CM | POA: Diagnosis not present

## 2017-02-22 DIAGNOSIS — E559 Vitamin D deficiency, unspecified: Secondary | ICD-10-CM | POA: Diagnosis not present

## 2017-02-22 DIAGNOSIS — K219 Gastro-esophageal reflux disease without esophagitis: Secondary | ICD-10-CM

## 2017-02-22 DIAGNOSIS — D509 Iron deficiency anemia, unspecified: Secondary | ICD-10-CM

## 2017-02-22 LAB — COMPREHENSIVE METABOLIC PANEL
ALT: 19 U/L (ref 0–35)
AST: 20 U/L (ref 0–37)
Albumin: 4.5 g/dL (ref 3.5–5.2)
Alkaline Phosphatase: 35 U/L — ABNORMAL LOW (ref 39–117)
BUN: 14 mg/dL (ref 6–23)
CALCIUM: 9.6 mg/dL (ref 8.4–10.5)
CHLORIDE: 101 meq/L (ref 96–112)
CO2: 33 meq/L — AB (ref 19–32)
CREATININE: 0.94 mg/dL (ref 0.40–1.20)
GFR: 63.41 mL/min (ref >60.00)
Glucose, Bld: 91 mg/dL (ref 70–99)
Potassium: 4 mEq/L (ref 3.5–5.1)
SODIUM: 141 meq/L (ref 135–145)
Total Bilirubin: 1 mg/dL (ref 0.2–1.2)
Total Protein: 7.1 g/dL (ref 6.0–8.3)

## 2017-02-22 LAB — LIPID PANEL
CHOL/HDL RATIO: 2
Cholesterol: 158 mg/dL (ref 0–200)
HDL: 66.3 mg/dL (ref >39.00)
LDL CALC: 77 mg/dL (ref 0–99)
NONHDL: 91.96
TRIGLYCERIDES: 76 mg/dL (ref 0.0–149.0)
VLDL: 15.2 mg/dL (ref 0.0–40.0)

## 2017-02-22 LAB — VITAMIN D 25 HYDROXY (VIT D DEFICIENCY, FRACTURES): VITD: 22.52 ng/mL — AB (ref 30.00–100.00)

## 2017-02-22 LAB — HEMOGLOBIN A1C: Hgb A1c MFr Bld: 5.8 % (ref 4.6–6.5)

## 2017-02-22 LAB — VITAMIN B12: VITAMIN B 12: 256 pg/mL (ref 211–911)

## 2017-02-22 MED ORDER — ROSUVASTATIN CALCIUM 5 MG PO TABS: 5.0000 mg | ORAL_TABLET | ORAL | 3 refills | Status: DC

## 2017-02-22 NOTE — Patient Instructions (Addendum)
I appreciate your concern about continuing your PPI in light of the recently published studies suggesting an association with increased risk of osteoporosis, .  There is also an association with dementia and kidney failure.  I advise you to try alternating   Protonix with famotidine 20 mg once or twice daily,  or to  ranitidine 150 mg once or twice daily.  These medications are  H2 blockers and are available without a prescriptions.   if your reflux symptoms are controlled,  You can then try switching  to the daily h2 blocker.   I have changed your crestor dosing instructions to every other day  I

## 2017-02-22 NOTE — Progress Notes (Signed)
Subjective:  Patient ID: Donna Richards, female    DOB: December 29, 1950  Age: 66 y.o. MRN: [id number]  CC: The primary encounter diagnosis was Hyperlipidemia with target LDL less than 100. Diagnoses of Vitamin D deficiency, Long-term use of high-risk medication, Impaired fasting glucose, Gastroesophageal reflux disease without esophagitis, Microcytic anemia, B12 deficiency, and Prediabetes were also pertinent to this visit.  HPI Donna Richards presents for follow up on hypertension and hyperlipidemia  Jethro Bastos for mild intermittent asthma , last visit Oct 2018.  Has not used rescue inhaler in over 6 months.  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  Patient is taking her medications as prescribed and notes no adverse effects.  Home BP readings have been done about once per week and are  generally < 130/80 .  She is avoiding added salt in her diet but not  walking regularly for exercise  .   GERD:  Currently managed with PPI, but she is concerned about the recently published studies suggesting an association with increased risk of dementia .  No history of Barrett's esophagus.    Lab Results  Component Value Date   CHOL 158 02/22/2017   HDL 66.30 02/22/2017   LDLCALC 77 02/22/2017   LDLDIRECT 81.0 08/19/2015   TRIG 76.0 02/22/2017   CHOLHDL 2 02/22/2017      Was turned down for insurance policy due to untreated sleep apnea  Outpatient Medications Prior to Visit  Medication Sig Dispense Refill  . albuterol (PROVENTIL HFA;VENTOLIN HFA) 108 (90 Base) MCG/ACT inhaler Inhale into the lungs.    . fluticasone (FLONASE) 50 MCG/ACT nasal spray instill 1 spray into each nostril twice a day 16 g 5  . hydrochlorothiazide (HYDRODIURIL) 25 MG tablet take 1 tablet by mouth once daily 30 tablet 2  . pantoprazole (PROTONIX) 40 MG tablet take 1 tablet by mouth once daily 30 tablet 2  . PULMICORT  FLEXHALER 180 MCG/ACT inhaler Inhale 180 mcg into the lungs as needed.  1  . RA ALLERGY RELIEF 180 MG tablet take 1 tablet by mouth once daily 30 tablet 11  . triamcinolone cream (KENALOG) 0.1 % Apply 1 application topically 2 (two) times daily. 30 g 0  . rosuvastatin (CRESTOR) 5 MG tablet take 1 tablet by mouth once daily 30 tablet 5  . nystatin ointment (MYCOSTATIN) Apply 1 application topically 2 (two) times daily. Apply thin layer twice daily on areas for 2 weeks (Patient not taking: Reported on 08/23/2016) 30 g 0   No facility-administered medications prior to visit.     Review of Systems;  Patient denies headache, fevers, malaise, unintentional weight loss, skin rash, eye pain, sinus congestion and sinus pain, sore throat, dysphagia,  hemoptysis , cough, dyspnea, wheezing, chest pain, palpitations, orthopnea, edema, abdominal pain, nausea, melena, diarrhea, constipation, flank pain, dysuria, hematuria, urinary  Frequency, nocturia, numbness, tingling, seizures,  Focal weakness, Loss of consciousness,  Tremor, insomnia, depression, anxiety, and suicidal ideation.      Objective:  BP 104/78 (BP Location: Left Arm, Patient Position: Sitting, Cuff Size: Large)   Pulse 64   Temp 97.7 F (36.5 C) (Oral)   Resp 15   Ht 5' 4.5" (1.638 m)   Wt 216 lb (98 kg)   SpO2 99%   BMI 36.50 kg/m   BP Readings from Last 3 Encounters:  02/22/17 104/78  08/23/16 104/76  08/19/15 104/80    Wt Readings from Last 3 Encounters:  02/22/17 216 lb (98 kg)  08/23/16 214 lb 9.6 oz (97.3 kg)  08/19/15 211 lb 6.4 oz (95.9 kg)    General appearance: alert, cooperative and appears stated age Ears: normal TM's and external ear canals both ears Throat: lips, mucosa, and tongue normal; teeth and gums normal Neck: no adenopathy, no carotid bruit, supple, symmetrical, trachea midline and thyroid not enlarged, symmetric, no tenderness/mass/nodules Back: symmetric, no curvature. ROM normal. No CVA  tenderness. Lungs: clear to auscultation bilaterally Heart: regular rate and rhythm, S1, S2 normal, no murmur, click, rub or gallop Abdomen: soft, non-tender; bowel sounds normal; no masses,  no organomegaly Pulses: 2+ and symmetric Skin: Skin color, texture, turgor normal. No rashes or lesions Lymph nodes: Cervical, supraclavicular, and axillary nodes normal.  Lab Results  Component Value Date   HGBA1C 5.8 02/22/2017   HGBA1C 6.1 08/23/2016    Lab Results  Component Value Date   CREATININE 0.94 02/22/2017   CREATININE 1.03 08/23/2016   CREATININE 1.00 08/19/2015    Lab Results  Component Value Date   WBC 5.4 08/19/2015   HGB 11.5 (L) 08/19/2015   HCT 36.1 08/19/2015   PLT 267.0 08/19/2015   GLUCOSE 91 02/22/2017   CHOL 158 02/22/2017   TRIG 76.0 02/22/2017   HDL 66.30 02/22/2017   LDLDIRECT 81.0 08/19/2015   LDLCALC 77 02/22/2017   ALT 19 02/22/2017   AST 20 02/22/2017   NA 141 02/22/2017   K 4.0 02/22/2017   CL 101 02/22/2017   CREATININE 0.94 02/22/2017   BUN 14 02/22/2017   CO2 33 (H) 02/22/2017   TSH 1.47 08/23/2016   HGBA1C 5.8 02/22/2017    Ct Abdomen Pelvis W Contrast  Result Date: 12/03/2014 CLINICAL DATA:  Left side abdominal pain, lower abdominal pain starting Saturday, nausea and vomiting for 3 days EXAM: CT ABDOMEN AND PELVIS WITH CONTRAST TECHNIQUE: Multidetector CT imaging of the abdomen and pelvis was performed using the standard protocol following bolus administration of intravenous contrast. CONTRAST:  137mL OMNIPAQUE IOHEXOL 300 MG/ML  SOLN COMPARISON:  None. FINDINGS: The lung bases are unremarkable. There is levoscoliosis of the lumbar spine. Degenerative changes lower thoracic and lumbar spine. Enhanced liver shows no biliary ductal dilatation. There is a small cyst in medial segment of left hepatic lobe measures 6 mm. No calcified gallstones are noted within gallbladder. The pancreas, spleen and adrenal glands are unremarkable. No aortic aneurysm.  No small bowel obstruction. No ascites or free air. No adenopathy. Normal appendix. No pericecal inflammation. In axial image 48 there is short segment thickening of small bowel wall in left mid abdomen. This is confirmed in coronal image 49. Findings are suspicious for short segment enteritis. There is no evidence of mesenteric fluid collection. No mesenteric abscess. Kidneys are symmetrical in size and enhancement. No hydronephrosis or hydroureter. Delayed renal images shows bilateral renal symmetrical excretion. Bilateral visualized proximal ureter is unremarkable. Descending colon diverticula are noted. No evidence of acute diverticulitis. Multiple sigmoid colon diverticula. No evidence of acute diverticulitis. The patient is status post hysterectomy. The urinary bladder is unremarkable. Some stool noted in distal sigmoid colon and rectum. IMPRESSION: 1. There is subtle short segment thickening of small bowel wall in left mid abdomen. Best seen in axial image 48 and coronal image 49. Findings are suspicious for mild segmental enteritis. There is no evidence of adjacent mesenteric abscess or fluid collection. No perforation. 2. Normal appendix. No  small bowel obstruction. No colonic obstruction. No pericecal inflammation. 3. Descending colon and sigmoid colon diverticula. No evidence of acute diverticulitis. 4. Status post hysterectomy. 5. No hydronephrosis or hydroureter. Electronically Signed   By: Lahoma Crocker M.D.   On: 12/03/2014 14:34    Assessment & Plan:   Problem List Items Addressed This Visit    B12 deficiency    Supplementation advised ,.  intrnsic  factor ab ordered      Relevant Orders   Intrinsic Factor Antibodies   GERD (gastroesophageal reflux disease)    Discussed current controversy regarding prolonged use of PPI in patients without documented Barretts esophagus.  Patient has no prior EGD but has been on PPI therapy for > 5 years (per patient).  Suggested trial of pepcid 20 mg daily.   If GERD symptoms return,  advised her to accept referral for EGD.       Hyperlipidemia with target LDL less than 100 - Primary    Lipoids are at goal on every other day dosing of crestor,   rx updated.  Lab Results  Component Value Date   CHOL 158 02/22/2017   HDL 66.30 02/22/2017   LDLCALC 77 02/22/2017   LDLDIRECT 81.0 08/19/2015   TRIG 76.0 02/22/2017   CHOLHDL 2 02/22/2017   Lab Results  Component Value Date   ALT 19 02/22/2017   AST 20 02/22/2017   ALKPHOS 35 (L) 02/22/2017   BILITOT 1.0 02/22/2017         Relevant Medications   rosuvastatin (CRESTOR) 5 MG tablet   Other Relevant Orders   Hemoglobin A1c (Completed)   Lipid panel (Completed)   Microcytic anemia    Follow up labs were ordered in July 2017 but never done.  Reordering CBC and iron studies .  B12 is < 300       Relevant Orders   Iron, TIBC and Ferritin Panel   CBC with Differential/Platelet   TSH   Fecal occult blood, imunochemical   Prediabetes    Her  random glucose is not  elevated but her A1c suggests she is at risk for developing diabetes.  I recommend he follow a low glycemic index diet and particpate regularly in an aerobic  exercise activity.  We should check an A1c in 6 months.        Vitamin D deficiency    Recurrent,  Likely due ot lack of sun expoausre and use of PPI .  rec De\Risdol weekly for 3 months  Then 2000 IUs daily thereafter       Relevant Orders   VITAMIN D 25 Hydroxy (Vit-D Deficiency, Fractures) (Completed)    Other Visit Diagnoses    Long-term use of high-risk medication       Relevant Orders   Vitamin B12 (Completed)   Comprehensive metabolic panel (Completed)   Impaired fasting glucose          I have discontinued Lance C. Kenedy's nystatin ointment. I have also changed her rosuvastatin. Additionally, I am having her start on ergocalciferol. Lastly, I am having her maintain her RA ALLERGY RELIEF, fluticasone, PULMICORT FLEXHALER, triamcinolone cream, albuterol,  pantoprazole, and hydrochlorothiazide.  Meds ordered this encounter  Medications  . rosuvastatin (CRESTOR) 5 MG tablet    Sig: Take 1 tablet (5 mg total) by mouth every other day.    Dispense:  45 tablet    Refill:  3  . ergocalciferol (DRISDOL) 50000 units capsule    Sig: Take 1 capsule (50,000 Units total) by  mouth once a week.    Dispense:  4 capsule    Refill:  2    Medications Discontinued During This Encounter  Medication Reason  . nystatin ointment (MYCOSTATIN) Patient has not taken in last 30 days  . rosuvastatin (CRESTOR) 5 MG tablet     Follow-up: Return in about 6 months (around 08/23/2017).   Crecencio Mc, MD

## 2017-02-24 ENCOUNTER — Encounter: Payer: Self-pay | Admitting: Internal Medicine

## 2017-02-24 DIAGNOSIS — R7303 Prediabetes: Secondary | ICD-10-CM | POA: Insufficient documentation

## 2017-02-24 DIAGNOSIS — E538 Deficiency of other specified B group vitamins: Secondary | ICD-10-CM | POA: Insufficient documentation

## 2017-02-24 MED ORDER — ERGOCALCIFEROL 1.25 MG (50000 UT) PO CAPS: 50000.0000 [IU] | ORAL_CAPSULE | ORAL | 2 refills | Status: DC

## 2017-02-24 NOTE — Assessment & Plan Note (Signed)
Recurrent,  Likely due ot lack of sun expoausre and use of PPI .  rec De\Risdol weekly for 3 months  Then 2000 IUs daily thereafter

## 2017-02-24 NOTE — Assessment & Plan Note (Signed)
Follow up labs were ordered in July 2017 but never done.  Reordering CBC and iron studies .  B12 is < 300

## 2017-02-24 NOTE — Assessment & Plan Note (Signed)
Lipoids are at goal on every other day dosing of crestor,   rx updated.  Lab Results  Component Value Date   CHOL 158 02/22/2017   HDL 66.30 02/22/2017   LDLCALC 77 02/22/2017   LDLDIRECT 81.0 08/19/2015   TRIG 76.0 02/22/2017   CHOLHDL 2 02/22/2017   Lab Results  Component Value Date   ALT 19 02/22/2017   AST 20 02/22/2017   ALKPHOS 35 (L) 02/22/2017   BILITOT 1.0 02/22/2017

## 2017-02-24 NOTE — Assessment & Plan Note (Signed)
Supplementation advised ,.  intrnsic  factor ab ordered

## 2017-02-24 NOTE — Assessment & Plan Note (Signed)
Her  random glucose is not  elevated but her A1c suggests she is at risk for developing diabetes.  I recommend he follow a low glycemic index diet and particpate regularly in an aerobic  exercise activity.  We should check an A1c in 6 months.   

## 2017-02-24 NOTE — Assessment & Plan Note (Signed)
Discussed current controversy regarding prolonged use of PPI in patients without documented Barretts esophagus.  Patient has no prior EGD but has been on PPI therapy for > 5 years (per patient).  Suggested trial of pepcid 20 mg daily.  If GERD symptoms return,  advised her to accept referral for EGD.  

## 2017-05-15 ENCOUNTER — Other Ambulatory Visit: Payer: Self-pay | Admitting: Internal Medicine

## 2017-05-18 ENCOUNTER — Other Ambulatory Visit: Payer: Self-pay

## 2017-05-18 MED ORDER — PANTOPRAZOLE SODIUM 40 MG PO TBEC: 40.0000 mg | DELAYED_RELEASE_TABLET | Freq: Every day | ORAL | 1 refills | Status: DC

## 2017-05-25 LAB — HM COLONOSCOPY

## 2017-08-23 ENCOUNTER — Encounter: Payer: Self-pay | Admitting: Internal Medicine

## 2017-08-23 ENCOUNTER — Ambulatory Visit: Payer: Managed Care, Other (non HMO) | Admitting: Internal Medicine

## 2017-08-23 VITALS — BP 116/72 | HR 64 | Temp 98.2°F | Resp 15 | Ht 64.5 in | Wt 214.0 lb

## 2017-08-23 DIAGNOSIS — D509 Iron deficiency anemia, unspecified: Secondary | ICD-10-CM

## 2017-08-23 DIAGNOSIS — E785 Hyperlipidemia, unspecified: Secondary | ICD-10-CM | POA: Diagnosis not present

## 2017-08-23 DIAGNOSIS — R7303 Prediabetes: Secondary | ICD-10-CM

## 2017-08-23 DIAGNOSIS — E538 Deficiency of other specified B group vitamins: Secondary | ICD-10-CM

## 2017-08-23 DIAGNOSIS — D126 Benign neoplasm of colon, unspecified: Secondary | ICD-10-CM

## 2017-08-23 DIAGNOSIS — Z0000 Encounter for general adult medical examination without abnormal findings: Secondary | ICD-10-CM | POA: Diagnosis not present

## 2017-08-23 DIAGNOSIS — I10 Essential (primary) hypertension: Secondary | ICD-10-CM

## 2017-08-23 DIAGNOSIS — E559 Vitamin D deficiency, unspecified: Secondary | ICD-10-CM

## 2017-08-23 LAB — CBC WITH DIFFERENTIAL/PLATELET
BASOS ABS: 0.1 10*3/uL (ref 0.0–0.1)
Basophils Relative: 1.2 % (ref 0.0–3.0)
EOS PCT: 2.3 % (ref 0.0–5.0)
Eosinophils Absolute: 0.1 10*3/uL (ref 0.0–0.7)
HEMATOCRIT: 40.6 % (ref 36.0–46.0)
HEMOGLOBIN: 13.4 g/dL (ref 12.0–15.0)
LYMPHS PCT: 20.8 % (ref 12.0–46.0)
Lymphs Abs: 1.1 10*3/uL (ref 0.7–4.0)
MCHC: 33 g/dL (ref 30.0–36.0)
MCV: 83.2 fl (ref 78.0–100.0)
MONOS PCT: 8.8 % (ref 3.0–12.0)
Monocytes Absolute: 0.5 10*3/uL (ref 0.1–1.0)
Neutro Abs: 3.5 10*3/uL (ref 1.4–7.7)
Neutrophils Relative %: 66.9 % (ref 43.0–77.0)
Platelets: 212 10*3/uL (ref 150.0–400.0)
RBC: 4.88 Mil/uL (ref 3.87–5.11)
RDW: 15.4 % (ref 11.5–15.5)
WBC: 5.2 10*3/uL (ref 4.0–10.5)

## 2017-08-23 LAB — VITAMIN B12: Vitamin B-12: 278 pg/mL (ref 211–911)

## 2017-08-23 LAB — COMPREHENSIVE METABOLIC PANEL WITH GFR
ALT: 16 U/L (ref 0–35)
AST: 16 U/L (ref 0–37)
Albumin: 4.3 g/dL (ref 3.5–5.2)
Alkaline Phosphatase: 36 U/L — ABNORMAL LOW (ref 39–117)
BUN: 14 mg/dL (ref 6–23)
CO2: 33 meq/L — ABNORMAL HIGH (ref 19–32)
Calcium: 10.1 mg/dL (ref 8.4–10.5)
Chloride: 101 meq/L (ref 96–112)
Creatinine, Ser: 0.94 mg/dL (ref 0.40–1.20)
GFR: 63.32 mL/min
Glucose, Bld: 101 mg/dL — ABNORMAL HIGH (ref 70–99)
Potassium: 3.7 meq/L (ref 3.5–5.1)
Sodium: 142 meq/L (ref 135–145)
Total Bilirubin: 1 mg/dL (ref 0.2–1.2)
Total Protein: 6.7 g/dL (ref 6.0–8.3)

## 2017-08-23 LAB — LIPID PANEL
Cholesterol: 167 mg/dL (ref 0–200)
HDL: 64.4 mg/dL
LDL Cholesterol: 84 mg/dL (ref 0–99)
NonHDL: 102.77
Total CHOL/HDL Ratio: 3
Triglycerides: 92 mg/dL (ref 0.0–149.0)
VLDL: 18.4 mg/dL (ref 0.0–40.0)

## 2017-08-23 LAB — VITAMIN D 25 HYDROXY (VIT D DEFICIENCY, FRACTURES): VITD: 25.83 ng/mL — ABNORMAL LOW (ref 30.00–100.00)

## 2017-08-23 LAB — HEMOGLOBIN A1C: Hgb A1c MFr Bld: 6 % (ref 4.6–6.5)

## 2017-08-23 LAB — TSH: TSH: 1.25 u[IU]/mL (ref 0.35–4.50)

## 2017-08-23 MED ORDER — ZOSTER VAC RECOMB ADJUVANTED 50 MCG/0.5ML IM SUSR: 0.5000 mL | Freq: Once | INTRAMUSCULAR | 1 refills | Status: AC

## 2017-08-23 NOTE — Progress Notes (Signed)
Patient ID: Donna Richards, female    DOB: 05/31/1950  Age: 67 y.o. MRN: [id number]  The patient is here for annual PREVENTIVE  examination   HAD 5 YR FOLLOW UP COLONOSCOPY. 2 POLYPS REMOVED  5 YR FLLLOW UP  DUE FOR MAMMOGRAM   TAKING 1000 UUS D3 DAIY,  NO B12   The risk factors are reflected in the social history.  The roster of all physicians providing medical care to patient - is listed in the Snapshot section of the chart.  Activities of daily living:  The patient is 100% independent in all ADLs: dressing, toileting, feeding as well as independent mobility  Home safety : The patient has smoke detectors in the home. They wear seatbelts.  There are no firearms at home. There is no violence in the home.   There is no risks for hepatitis, STDs or HIV. There is no   history of blood transfusion. They have no travel history to infectious disease endemic areas of the world.  The patient has seen their dentist in the last six month. They have seen their eye doctor in the last year. They admit to slight hearing difficulty with regard to whispered voices and some television programs.  They have deferred audiologic testing in the last year.  They do not  have excessive sun exposure. Discussed the need for sun protection: hats, long sleeves and use of sunscreen if there is significant sun exposure.   Diet: the importance of a healthy diet is discussed. They do have a healthy diet.  The benefits of regular aerobic exercise were discussed. She walks 4 times per week ,  20 minutes.   Depression screen: there are no signs or vegative symptoms of depression- irritability, change in appetite, anhedonia, sadness/tearfullness.  Cognitive assessment: the patient manages all their financial and personal affairs and is actively engaged. They could relate day,date,year and events; recalled 2/3 objects at 3 minutes; performed clock-face test normally.  The following portions of the patient's history were  reviewed and updated as appropriate: allergies, current medications, past family history, past medical history,  past surgical history, past social history  and problem list.  Visual acuity was not assessed per patient preference since she has regular follow up with her ophthalmologist. Hearing and body mass index were assessed and reviewed.   During the course of the visit the patient was educated and counseled about appropriate screening and preventive services including : fall prevention , diabetes screening, nutrition counseling, colorectal cancer screening, and recommended immunizations.    CC: The primary encounter diagnosis was Vitamin D deficiency. Diagnoses of Prediabetes, Hyperlipidemia with target LDL less than 100, Microcytic anemia, B12 deficiency, Visit for preventive health examination, Essential hypertension, and Adenomatous polyp of colon, unspecified part of colon were also pertinent to this visit.  History Kirah has a past medical history of Allergy, Arthritis, Asthma, Asthma, chronic (06/09/2014), GERD (gastroesophageal reflux disease), Hyperlipidemia, Inflammatory polyps of colon ([place]), Migraines, Stroke ([place]) (1992), and UTI (urinary tract infection).   She has a past surgical history that includes Bilateral oophorectomy and Abdominal hysterectomy (1996).   Her family history includes COPD in her father; Diabetes in her maternal grandmother and mother; Heart disease in her father, maternal grandfather, and mother; Hyperlipidemia in her mother; Kidney disease in her mother; Stroke in her paternal grandmother.She reports that she has never smoked. She has never used smokeless tobacco. She reports that she does not drink alcohol or use drugs.  Outpatient Medications Prior to Visit  Medication Sig Dispense Refill  . albuterol (PROVENTIL HFA;VENTOLIN HFA) 108 (90 Base) MCG/ACT inhaler Inhale into the lungs.    . fluticasone (FLONASE) 50 MCG/ACT nasal spray instill 1 spray into  each nostril twice a day 16 g 5  . hydrochlorothiazide (HYDRODIURIL) 25 MG tablet TAKE 1 TABLET BY MOUTH ONCE DAILY 90 tablet 1  . pantoprazole (PROTONIX) 40 MG tablet Take 1 tablet (40 mg total) by mouth daily. 90 tablet 1  . PULMICORT FLEXHALER 180 MCG/ACT inhaler Inhale 180 mcg into the lungs as needed.  1  . RA ALLERGY RELIEF 180 MG tablet take 1 tablet by mouth once daily 30 tablet 11  . rosuvastatin (CRESTOR) 5 MG tablet TAKE 1 TABLET BY MOUTH ONCE DAILY 30 tablet 0  . triamcinolone cream (KENALOG) 0.1 % Apply 1 application topically 2 (two) times daily. 30 g 0  . ergocalciferol (DRISDOL) 50000 units capsule Take 1 capsule (50,000 Units total) by mouth once a week. (Patient not taking: Reported on 08/23/2017) 4 capsule 2   No facility-administered medications prior to visit.     Review of Systems   Patient denies headache, fevers, malaise, unintentional weight loss, skin rash, eye pain, sinus congestion and sinus pain, sore throat, dysphagia,  hemoptysis , cough, dyspnea, wheezing, chest pain, palpitations, orthopnea, edema, abdominal pain, nausea, melena, diarrhea, constipation, flank pain, dysuria, hematuria, urinary  Frequency, nocturia, numbness, tingling, seizures,  Focal weakness, Loss of consciousness,  Tremor, insomnia, depression, anxiety, and suicidal ideation.     Objective:  BP 116/72 (BP Location: Left Arm, Patient Position: Sitting, Cuff Size: Large)   Pulse 64   Temp 98.2 F (36.8 C) (Oral)   Resp 15   Ht 5' 4.5" (1.638 m)   Wt 214 lb (97.1 kg)   SpO2 96%   BMI 36.17 kg/m   Physical Exam   General appearance: alert, cooperative and appears stated age Head: Normocephalic, without obvious abnormality, atraumatic Eyes: conjunctivae/corneas clear. PERRL, EOM's intact. Fundi benign. Ears: normal TM's and external ear canals both ears Nose: Nares normal. Septum midline. Mucosa normal. No drainage or sinus tenderness. Throat: lips, mucosa, and tongue normal; teeth and  gums normal Neck: no adenopathy, no carotid bruit, no JVD, supple, symmetrical, trachea midline and thyroid not enlarged, symmetric, no tenderness/mass/nodules Lungs: clear to auscultation bilaterally Breasts: normal appearance, no masses or tenderness Heart: regular rate and rhythm, S1, S2 normal, no murmur, click, rub or gallop Abdomen: soft, non-tender; bowel sounds normal; no masses,  no organomegaly Extremities: extremities normal, atraumatic, no cyanosis or edema Pulses: 2+ and symmetric Skin: Skin color, texture, turgor normal. No rashes or lesions Neurologic: Alert and oriented X 3, normal strength and tone. Normal symmetric reflexes. Normal coordination and gait.      Assessment & Plan:   Problem List Items Addressed This Visit    Vitamin D deficiency - Primary      Likely due to lack of sun exposure and use of PPI .  rec she increase d3 dose to 2000 Ius daily       Relevant Orders   VITAMIN D 25 Hydroxy (Vit-D Deficiency, Fractures) (Completed)   Visit for preventive health examination    Annual comprehensive preventive exam was done as well as an evaluation and management of chronic conditions .  During the course of the visit the patient was educated and counseled about appropriate screening and preventive services including :  diabetes screening, lipid analysis with projected  10 year  risk for CAD , nutrition counseling,  breast, cervical and colorectal cancer screening, and recommended immunizations.  Printed recommendations for health maintenance screenings was given      Prediabetes      Rising A1c suggests she is at risk for developing diabetes.  Strongly recommend she lose 20 lbs this year following a glycemic index diet and particpate regularly in an aerobic  exercise activity.  We should check an A1c in 6 months.   Lab Results  Component Value Date   HGBA1C 6.0 08/23/2017          Relevant Orders   Comprehensive metabolic panel (Completed)   Hemoglobin A1c  (Completed)   RESOLVED: Microcytic anemia   Relevant Orders   TSH (Completed)   CBC with Differential/Platelet (Completed)   Hypertension    Well controlled on current regimen. Renal function stable, no changes today.  Lab Results  Component Value Date   CREATININE 0.94 08/23/2017    Lab Results  Component Value Date   NA 142 08/23/2017   K 3.7 08/23/2017   CL 101 08/23/2017   CO2 33 (H) 08/23/2017          Hyperlipidemia with target LDL less than 100    Lipoids are at goal on every other day dosing of crestor,   rx updated.  Lab Results  Component Value Date   CHOL 167 08/23/2017   HDL 64.40 08/23/2017   LDLCALC 84 08/23/2017   LDLDIRECT 81.0 08/19/2015   TRIG 92.0 08/23/2017   CHOLHDL 3 08/23/2017   Lab Results  Component Value Date   ALT 16 08/23/2017   AST 16 08/23/2017   ALKPHOS 36 (L) 08/23/2017   BILITOT 1.0 08/23/2017         Relevant Orders   Lipid panel (Completed)   B12 deficiency    Recurrent ,  Methylmalonic acid and inrinsic factor antibody ordered .  Lab Results  Component Value Date   [id number] 08/23/2017         Relevant Orders   Methylmalonic Acid   Vitamin B12 (Completed)   Adenomatous colon polyp    5 yr follow up colonoscopy was done in March and report ha not been received          I have discontinued Tatiyana C. Dart's ergocalciferol. I am also having her start on Zoster Vaccine Adjuvanted. Additionally, I am having her maintain her RA ALLERGY RELIEF, fluticasone, PULMICORT FLEXHALER, triamcinolone cream, albuterol, hydrochlorothiazide, rosuvastatin, and pantoprazole.  Meds ordered this encounter  Medications  . Zoster Vaccine Adjuvanted [facility]) injection    Sig: Inject 0.5 mLs into the muscle once for 1 dose.    Dispense:  1 each    Refill:  1    Medications Discontinued During This Encounter  Medication Reason  . ergocalciferol (DRISDOL) 50000 units capsule Completed Course    Follow-up: Return in  about 6 months (around 02/22/2018).   Crecencio Mc, MD

## 2017-08-23 NOTE — Patient Instructions (Addendum)
I recommend that you lose 20 lbs this year,  To prevent progression to diabetes.  Cut out more sugar .    Walk for 30 minutes daily    You might want to try a premixed protein drink called Premier Protein shake in the morning. Instead of your granola bar .   It is higher in protein and very low sugar.    160 cal  30 g protein  1 g sugar 50% calcium needs    Health Maintenance for Postmenopausal Women Menopause is a normal process in which your reproductive ability comes to an end. This process happens gradually over a span of months to years, usually between the ages of 18 and 23. Menopause is complete when you have missed 12 consecutive menstrual periods. It is important to talk with your health care provider about some of the most common conditions that affect postmenopausal women, such as heart disease, cancer, and bone loss (osteoporosis). Adopting a healthy lifestyle and getting preventive care can help to promote your health and wellness. Those actions can also lower your chances of developing some of these common conditions. What should I know about menopause? During menopause, you may experience a number of symptoms, such as:  Moderate-to-severe hot flashes.  Night sweats.  Decrease in sex drive.  Mood swings.  Headaches.  Tiredness.  Irritability.  Memory problems.  Insomnia.  Choosing to treat or not to treat menopausal changes is an individual decision that you make with your health care provider. What should I know about hormone replacement therapy and supplements? Hormone therapy products are effective for treating symptoms that are associated with menopause, such as hot flashes and night sweats. Hormone replacement carries certain risks, especially as you become older. If you are thinking about using estrogen or estrogen with progestin treatments, discuss the benefits and risks with your health care provider. What should I know about heart disease and  stroke? Heart disease, heart attack, and stroke become more likely as you age. This may be due, in part, to the hormonal changes that your body experiences during menopause. These can affect how your body processes dietary fats, triglycerides, and cholesterol. Heart attack and stroke are both medical emergencies. There are many things that you can do to help prevent heart disease and stroke:  Have your blood pressure checked at least every 1-2 years. High blood pressure causes heart disease and increases the risk of stroke.  If you are 68-50 years old, ask your health care provider if you should take aspirin to prevent a heart attack or a stroke.  Do not use any tobacco products, including cigarettes, chewing tobacco, or electronic cigarettes. If you need help quitting, ask your health care provider.  It is important to eat a healthy diet and maintain a healthy weight. ? Be sure to include plenty of vegetables, fruits, low-fat dairy products, and lean protein. ? Avoid eating foods that are high in solid fats, added sugars, or salt (sodium).  Get regular exercise. This is one of the most important things that you can do for your health. ? Try to exercise for at least 150 minutes each week. The type of exercise that you do should increase your heart rate and make you sweat. This is known as moderate-intensity exercise. ? Try to do strengthening exercises at least twice each week. Do these in addition to the moderate-intensity exercise.  Know your numbers.Ask your health care provider to check your cholesterol and your blood glucose. Continue to have your  blood tested as directed by your health care provider.  What should I know about cancer screening? There are several types of cancer. Take the following steps to reduce your risk and to catch any cancer development as early as possible. Breast Cancer  Practice breast self-awareness. ? This means understanding how your breasts normally appear  and feel. ? It also means doing regular breast self-exams. Let your health care provider know about any changes, no matter how small.  If you are 84 or older, have a clinician do a breast exam (clinical breast exam or CBE) every year. Depending on your age, family history, and medical history, it may be recommended that you also have a yearly breast X-ray (mammogram).  If you have a family history of breast cancer, talk with your health care provider about genetic screening.  If you are at high risk for breast cancer, talk with your health care provider about having an MRI and a mammogram every year.  Breast cancer (BRCA) gene test is recommended for women who have family members with BRCA-related cancers. Results of the assessment will determine the need for genetic counseling and BRCA1 and for BRCA2 testing. BRCA-related cancers include these types: ? Breast. This occurs in males or females. ? Ovarian. ? Tubal. This may also be called fallopian tube cancer. ? Cancer of the abdominal or pelvic lining (peritoneal cancer). ? Prostate. ? Pancreatic.  Cervical, Uterine, and Ovarian Cancer Your health care provider may recommend that you be screened regularly for cancer of the pelvic organs. These include your ovaries, uterus, and vagina. This screening involves a pelvic exam, which includes checking for microscopic changes to the surface of your cervix (Pap test).  For women ages 21-65, health care providers may recommend a pelvic exam and a Pap test every three years. For women ages 9-65, they may recommend the Pap test and pelvic exam, combined with testing for human papilloma virus (HPV), every five years. Some types of HPV increase your risk of cervical cancer. Testing for HPV may also be done on women of any age who have unclear Pap test results.  Other health care providers may not recommend any screening for nonpregnant women who are considered low risk for pelvic cancer and have no  symptoms. Ask your health care provider if a screening pelvic exam is right for you.  If you have had past treatment for cervical cancer or a condition that could lead to cancer, you need Pap tests and screening for cancer for at least 20 years after your treatment. If Pap tests have been discontinued for you, your risk factors (such as having a new sexual partner) need to be reassessed to determine if you should start having screenings again. Some women have medical problems that increase the chance of getting cervical cancer. In these cases, your health care provider may recommend that you have screening and Pap tests more often.  If you have a family history of uterine cancer or ovarian cancer, talk with your health care provider about genetic screening.  If you have vaginal bleeding after reaching menopause, tell your health care provider.  There are currently no reliable tests available to screen for ovarian cancer.  Lung Cancer Lung cancer screening is recommended for adults 45-69 years old who are at high risk for lung cancer because of a history of smoking. A yearly low-dose CT scan of the lungs is recommended if you:  Currently smoke.  Have a history of at least 30 pack-years of smoking  and you currently smoke or have quit within the past 15 years. A pack-year is smoking an average of one pack of cigarettes per day for one year.  Yearly screening should:  Continue until it has been 15 years since you quit.  Stop if you develop a health problem that would prevent you from having lung cancer treatment.  Colorectal Cancer  This type of cancer can be detected and can often be prevented.  Routine colorectal cancer screening usually begins at age 17 and continues through age 75.  If you have risk factors for colon cancer, your health care provider may recommend that you be screened at an earlier age.  If you have a family history of colorectal cancer, talk with your health care  provider about genetic screening.  Your health care provider may also recommend using home test kits to check for hidden blood in your stool.  A small camera at the end of a tube can be used to examine your colon directly (sigmoidoscopy or colonoscopy). This is done to check for the earliest forms of colorectal cancer.  Direct examination of the colon should be repeated every 5-10 years until age 38. However, if early forms of precancerous polyps or small growths are found or if you have a family history or genetic risk for colorectal cancer, you may need to be screened more often.  Skin Cancer  Check your skin from head to toe regularly.  Monitor any moles. Be sure to tell your health care provider: ? About any new moles or changes in moles, especially if there is a change in a mole's shape or color. ? If you have a mole that is larger than the size of a pencil eraser.  If any of your family members has a history of skin cancer, especially at a young age, talk with your health care provider about genetic screening.  Always use sunscreen. Apply sunscreen liberally and repeatedly throughout the day.  Whenever you are outside, protect yourself by wearing long sleeves, pants, a wide-brimmed hat, and sunglasses.  What should I know about osteoporosis? Osteoporosis is a condition in which bone destruction happens more quickly than new bone creation. After menopause, you may be at an increased risk for osteoporosis. To help prevent osteoporosis or the bone fractures that can happen because of osteoporosis, the following is recommended:  If you are 70-62 years old, get at least 1,000 mg of calcium and at least 600 mg of vitamin D per day.  If you are older than age 63 but younger than age 54, get at least 1,200 mg of calcium and at least 600 mg of vitamin D per day.  If you are older than age 21, get at least 1,200 mg of calcium and at least 800 mg of vitamin D per day.  Smoking and excessive  alcohol intake increase the risk of osteoporosis. Eat foods that are rich in calcium and vitamin D, and do weight-bearing exercises several times each week as directed by your health care provider. What should I know about how menopause affects my mental health? Depression may occur at any age, but it is more common as you become older. Common symptoms of depression include:  Low or sad mood.  Changes in sleep patterns.  Changes in appetite or eating patterns.  Feeling an overall lack of motivation or enjoyment of activities that you previously enjoyed.  Frequent crying spells.  Talk with your health care provider if you think that you are experiencing  depression. What should I know about immunizations? It is important that you get and maintain your immunizations. These include:  Tetanus, diphtheria, and pertussis (Tdap) booster vaccine.  Influenza every year before the flu season begins.  Pneumonia vaccine.  Shingles vaccine.  Your health care provider may also recommend other immunizations. This information is not intended to replace advice given to you by your health care provider. Make sure you discuss any questions you have with your health care provider. Document Released: 04/28/2005 Document Revised: 09/24/2015 Document Reviewed: 12/08/2014 Elsevier Interactive Patient Education  2018 [employer].

## 2017-08-25 DIAGNOSIS — I10 Essential (primary) hypertension: Secondary | ICD-10-CM | POA: Insufficient documentation

## 2017-08-25 NOTE — Assessment & Plan Note (Addendum)
5 yr follow up colonoscopy was done in March and report ha not been received

## 2017-08-25 NOTE — Assessment & Plan Note (Signed)
Annual comprehensive preventive exam was done as well as an evaluation and management of chronic conditions .  During the course of the visit the patient was educated and counseled about appropriate screening and preventive services including :  diabetes screening, lipid analysis with projected  10 year  risk for CAD , nutrition counseling, breast, cervical and colorectal cancer screening, and recommended immunizations.  Printed recommendations for health maintenance screenings was given 

## 2017-08-25 NOTE — Assessment & Plan Note (Addendum)
Likely due to lack of sun exposure and use of PPI .  rec she increase d3 dose to 2000 Ius daily

## 2017-08-25 NOTE — Assessment & Plan Note (Signed)
Well controlled on current regimen. Renal function stable, no changes today.  Lab Results  Component Value Date   CREATININE 0.94 08/23/2017    Lab Results  Component Value Date   NA 142 08/23/2017   K 3.7 08/23/2017   CL 101 08/23/2017   CO2 33 (H) 08/23/2017

## 2017-08-25 NOTE — Assessment & Plan Note (Signed)
Lipoids are at goal on every other day dosing of crestor,   rx updated.  Lab Results  Component Value Date   CHOL 167 08/23/2017   HDL 64.40 08/23/2017   LDLCALC 84 08/23/2017   LDLDIRECT 81.0 08/19/2015   TRIG 92.0 08/23/2017   CHOLHDL 3 08/23/2017   Lab Results  Component Value Date   ALT 16 08/23/2017   AST 16 08/23/2017   ALKPHOS 36 (L) 08/23/2017   BILITOT 1.0 08/23/2017

## 2017-08-25 NOTE — Assessment & Plan Note (Addendum)
Rising A1c suggests she is at risk for developing diabetes.  Strongly recommend she lose 20 lbs this year following a glycemic index diet and particpate regularly in an aerobic  exercise activity.  We should check an A1c in 6 months.   Lab Results  Component Value Date   HGBA1C 6.0 08/23/2017

## 2017-08-25 NOTE — Assessment & Plan Note (Signed)
Recurrent ,  Methylmalonic acid and inrinsic factor antibody ordered .  Lab Results  Component Value Date   [id number] 08/23/2017

## 2017-08-29 LAB — METHYLMALONIC ACID, SERUM: METHYLMALONIC ACID, QUANT: 230 nmol/L (ref 87–318)

## 2017-11-14 ENCOUNTER — Other Ambulatory Visit: Payer: Self-pay | Admitting: Internal Medicine

## 2017-12-03 LAB — HM MAMMOGRAPHY

## 2017-12-14 ENCOUNTER — Other Ambulatory Visit: Payer: Self-pay | Admitting: Internal Medicine

## 2018-01-23 ENCOUNTER — Telehealth: Payer: Self-pay

## 2018-01-23 NOTE — Telephone Encounter (Signed)
I called patient in regards to her question if she could have flu and pneumonia shot together at the same time. I advised her that she could, but she was up to date on pneumonia shot & it wasn't needed.

## 2018-01-25 ENCOUNTER — Ambulatory Visit (INDEPENDENT_AMBULATORY_CARE_PROVIDER_SITE_OTHER): Payer: Managed Care, Other (non HMO) | Admitting: *Deleted

## 2018-01-25 DIAGNOSIS — Z23 Encounter for immunization: Secondary | ICD-10-CM | POA: Diagnosis not present

## 2018-02-22 ENCOUNTER — Ambulatory Visit: Payer: Managed Care, Other (non HMO) | Admitting: Internal Medicine

## 2018-02-22 ENCOUNTER — Encounter: Payer: Self-pay | Admitting: Internal Medicine

## 2018-02-22 VITALS — BP 118/76 | HR 68 | Temp 97.7°F | Resp 15 | Ht 64.5 in | Wt 211.0 lb

## 2018-02-22 DIAGNOSIS — D126 Benign neoplasm of colon, unspecified: Secondary | ICD-10-CM | POA: Diagnosis not present

## 2018-02-22 DIAGNOSIS — E785 Hyperlipidemia, unspecified: Secondary | ICD-10-CM

## 2018-02-22 DIAGNOSIS — E538 Deficiency of other specified B group vitamins: Secondary | ICD-10-CM | POA: Diagnosis not present

## 2018-02-22 DIAGNOSIS — R7303 Prediabetes: Secondary | ICD-10-CM

## 2018-02-22 DIAGNOSIS — I10 Essential (primary) hypertension: Secondary | ICD-10-CM

## 2018-02-22 DIAGNOSIS — G4733 Obstructive sleep apnea (adult) (pediatric): Secondary | ICD-10-CM

## 2018-02-22 DIAGNOSIS — E669 Obesity, unspecified: Secondary | ICD-10-CM

## 2018-02-22 LAB — COMPREHENSIVE METABOLIC PANEL
ALT: 18 U/L (ref 0–35)
AST: 20 U/L (ref 0–37)
Albumin: 4.2 g/dL (ref 3.5–5.2)
Alkaline Phosphatase: 31 U/L — ABNORMAL LOW (ref 39–117)
BUN: 17 mg/dL (ref 6–23)
CALCIUM: 9.6 mg/dL (ref 8.4–10.5)
CHLORIDE: 101 meq/L (ref 96–112)
CO2: 32 mEq/L (ref 19–32)
Creatinine, Ser: 0.96 mg/dL (ref 0.40–1.20)
GFR: 61.7 mL/min (ref >60.00)
Glucose, Bld: 89 mg/dL (ref 70–99)
POTASSIUM: 3.6 meq/L (ref 3.5–5.1)
SODIUM: 141 meq/L (ref 135–145)
Total Bilirubin: 0.8 mg/dL (ref 0.2–1.2)
Total Protein: 7.1 g/dL (ref 6.0–8.3)

## 2018-02-22 LAB — LIPID PANEL
CHOL/HDL RATIO: 2
Cholesterol: 153 mg/dL (ref 0–200)
HDL: 69.6 mg/dL (ref >39.00)
LDL CALC: 69 mg/dL (ref 0–99)
NonHDL: 83.79
Triglycerides: 72 mg/dL (ref 0.0–149.0)
VLDL: 14.4 mg/dL (ref 0.0–40.0)

## 2018-02-22 LAB — HEMOGLOBIN A1C: Hgb A1c MFr Bld: 6.1 % (ref 4.6–6.5)

## 2018-02-22 MED ORDER — ZOSTER VAC RECOMB ADJUVANTED 50 MCG/0.5ML IM SUSR: 0.5000 mL | Freq: Once | INTRAMUSCULAR | 1 refills | Status: AC

## 2018-02-22 NOTE — Progress Notes (Signed)
Subjective:  Patient ID: Donna Richards, female    DOB: 05-28-1950  Age: 67 y.o. MRN: [id number]  CC: The primary encounter diagnosis was Hyperlipidemia with target LDL less than 100. Diagnoses of Prediabetes, Adenomatous polyp of colon, unspecified part of colon, B12 deficiency, Essential hypertension, Obesity (BMI 30-39.9), and OSA (obstructive sleep apnea) were also pertinent to this visit.  HPI Donna Richards presents for 6 MONTH FOLLOW UP  On obesity with untreated OSA, prediabetes and hypertension .     Patient has no complaints today.  Patient is following a low glycemic index diet and taking all prescribed medications regularly without side effects.   Patient is not exercising  But intentionally trying to lose weight  with portion size reduction and has lost  3 lbs since June .    Marland Kitchen  Home BP readings have been done about once per week and are  generally < 130/80 .  She is avoiding added salt in her diet and walking regularly about 3 times per week for exercise    .SEES CHIROPRACTOR MONTHLY for management of low back pain /sciatica    Insomnia:  Trouble falling asleep.  Hasn't used CPAP in over 5 years.   . Bedtime hygiene reviewed,  Patient has been using an electronic book to read before bed.  Does not drink caffeinated beverages after 3 PM.  Only voids  bladder once per night.  No snoring partner. Does not drink alcohol to excess.  Not exercising excessively in the evening. Patient does have a history of anxiety but does not lie awake worrying about issues that cannot be resolved. Does not take stimulants. .  Outpatient Medications Prior to Visit  Medication Sig Dispense Refill  . albuterol (PROVENTIL HFA;VENTOLIN HFA) 108 (90 Base) MCG/ACT inhaler Inhale into the lungs.    . fluticasone (FLONASE) 50 MCG/ACT nasal spray instill 1 spray into each nostril twice a day 16 g 5  . hydrochlorothiazide (HYDRODIURIL) 25 MG tablet TAKE 1 TABLET BY MOUTH EVERY DAY 90 tablet 1  . pantoprazole  (PROTONIX) 40 MG tablet TAKE 1 TABLET(40 MG) BY MOUTH DAILY 90 tablet 1  . PULMICORT FLEXHALER 180 MCG/ACT inhaler Inhale 180 mcg into the lungs as needed.  1  . RA ALLERGY RELIEF 180 MG tablet take 1 tablet by mouth once daily 30 tablet 11  . rosuvastatin (CRESTOR) 5 MG tablet TAKE 1 TABLET BY MOUTH ONCE DAILY 30 tablet 0  . triamcinolone cream (KENALOG) 0.1 % Apply 1 application topically 2 (two) times daily. 30 g 0   No facility-administered medications prior to visit.     Review of Systems;  Patient denies headache, fevers, malaise, unintentional weight loss, skin rash, eye pain, sinus congestion and sinus pain, sore throat, dysphagia,  hemoptysis , cough, dyspnea, wheezing, chest pain, palpitations, orthopnea, edema, abdominal pain, nausea, melena, diarrhea, constipation, flank pain, dysuria, hematuria, urinary  Frequency, nocturia, numbness, tingling, seizures,  Focal weakness, Loss of consciousness,  Tremor, insomnia, depression, anxiety, and suicidal ideation.      Objective:  BP 118/76 (BP Location: Left Arm, Patient Position: Sitting, Cuff Size: Large)   Pulse 68   Temp 97.7 F (36.5 C) (Oral)   Resp 15   Ht 5' 4.5" (1.638 m)   Wt 211 lb (95.7 kg)   SpO2 99%   BMI 35.66 kg/m   BP Readings from Last 3 Encounters:  02/22/18 118/76  08/23/17 116/72  02/22/17 104/78    Wt Readings from Last 3 Encounters:  02/22/18 211 lb (95.7 kg)  08/23/17 214 lb (97.1 kg)  02/22/17 216 lb (98 kg)    General appearance: alert, cooperative and appears stated age Ears: normal TM's and external ear canals both ears Throat: lips, mucosa, and tongue normal; teeth and gums normal Neck: no adenopathy, no carotid bruit, supple, symmetrical, trachea midline and thyroid not enlarged, symmetric, no tenderness/mass/nodules Back: symmetric, no curvature. ROM normal. No CVA tenderness. Lungs: clear to auscultation bilaterally Heart: regular rate and rhythm, S1, S2 normal, no murmur, click, rub  or gallop Abdomen: soft, non-tender; bowel sounds normal; no masses,  no organomegaly Pulses: 2+ and symmetric Skin: Skin color, texture, turgor normal. No rashes or lesions Lymph nodes: Cervical, supraclavicular, and axillary nodes normal.  Lab Results  Component Value Date   HGBA1C 6.1 02/22/2018   HGBA1C 6.0 08/23/2017   HGBA1C 5.8 02/22/2017    Lab Results  Component Value Date   CREATININE 0.96 02/22/2018   CREATININE 0.94 08/23/2017   CREATININE 0.94 02/22/2017    Lab Results  Component Value Date   WBC 5.2 08/23/2017   HGB 13.4 08/23/2017   HCT 40.6 08/23/2017   PLT 212.0 08/23/2017   GLUCOSE 89 02/22/2018   CHOL 153 02/22/2018   TRIG 72.0 02/22/2018   HDL 69.60 02/22/2018   LDLDIRECT 81.0 08/19/2015   LDLCALC 69 02/22/2018   ALT 18 02/22/2018   AST 20 02/22/2018   NA 141 02/22/2018   K 3.6 02/22/2018   CL 101 02/22/2018   CREATININE 0.96 02/22/2018   BUN 17 02/22/2018   CO2 32 02/22/2018   TSH 1.25 08/23/2017   HGBA1C 6.1 02/22/2018    Ct Abdomen Pelvis W Contrast  Result Date: 12/03/2014 CLINICAL DATA:  Left side abdominal pain, lower abdominal pain starting Saturday, nausea and vomiting for 3 days EXAM: CT ABDOMEN AND PELVIS WITH CONTRAST TECHNIQUE: Multidetector CT imaging of the abdomen and pelvis was performed using the standard protocol following bolus administration of intravenous contrast. CONTRAST:  171mL OMNIPAQUE IOHEXOL 300 MG/ML  SOLN COMPARISON:  None. FINDINGS: The lung bases are unremarkable. There is levoscoliosis of the lumbar spine. Degenerative changes lower thoracic and lumbar spine. Enhanced liver shows no biliary ductal dilatation. There is a small cyst in medial segment of left hepatic lobe measures 6 mm. No calcified gallstones are noted within gallbladder. The pancreas, spleen and adrenal glands are unremarkable. No aortic aneurysm. No small bowel obstruction. No ascites or free air. No adenopathy. Normal appendix. No pericecal  inflammation. In axial image 48 there is short segment thickening of small bowel wall in left mid abdomen. This is confirmed in coronal image 49. Findings are suspicious for short segment enteritis. There is no evidence of mesenteric fluid collection. No mesenteric abscess. Kidneys are symmetrical in size and enhancement. No hydronephrosis or hydroureter. Delayed renal images shows bilateral renal symmetrical excretion. Bilateral visualized proximal ureter is unremarkable. Descending colon diverticula are noted. No evidence of acute diverticulitis. Multiple sigmoid colon diverticula. No evidence of acute diverticulitis. The patient is status post hysterectomy. The urinary bladder is unremarkable. Some stool noted in distal sigmoid colon and rectum. IMPRESSION: 1. There is subtle short segment thickening of small bowel wall in left mid abdomen. Best seen in axial image 48 and coronal image 49. Findings are suspicious for mild segmental enteritis. There is no evidence of adjacent mesenteric abscess or fluid collection. No perforation. 2. Normal appendix. No small bowel obstruction. No colonic obstruction. No pericecal inflammation. 3. Descending colon and sigmoid colon diverticula.  No evidence of acute diverticulitis. 4. Status post hysterectomy. 5. No hydronephrosis or hydroureter. Electronically Signed   By: Lahoma Crocker M.D.   On: 12/03/2014 14:34    Assessment & Plan:   Problem List Items Addressed This Visit    Adenomatous colon polyp   B12 deficiency    Recurrent ,  Methylmalonic acid and intrinsic factor antibody ordered but not done.   Lab Results  Component Value Date   [id number] 324 08/23/2017         Hyperlipidemia with target LDL less than 100 - Primary    Lipoids are at goal on every other day dosing of crestor,   rx updated.  Lab Results  Component Value Date   CHOL 153 02/22/2018   HDL 69.60 02/22/2018   LDLCALC 69 02/22/2018   LDLDIRECT 81.0 08/19/2015   TRIG 72.0 02/22/2018    CHOLHDL 2 02/22/2018   Lab Results  Component Value Date   ALT 18 02/22/2018   AST 20 02/22/2018   ALKPHOS 31 (L) 02/22/2018   BILITOT 0.8 02/22/2018         Relevant Orders   Lipid panel (Completed)   Hypertension    Well controlled on current regimen. Renal function stable, no changes today.  Lab Results  Component Value Date   CREATININE 0.96 02/22/2018   Lab Results  Component Value Date   NA 141 02/22/2018   K 3.6 02/22/2018   CL 101 02/22/2018   CO2 32 02/22/2018         Obesity (BMI 30-39.9)    I have addressed  BMI and recommended wt loss of 10% of body weight over the next 6 months using a low glycemic index diet and regular exercise a minimum of 5 days per week.        OSA (obstructive sleep apnea)    diagnosed with prior sleep study but treatment has been deferred  by patient.  Discussed the long term history of OSA , the risks of long term damage to heart and the signs and symptoms attributable to OSA.  Advised patient to consider  significant weight loss and/or use of CPAP.       Prediabetes      Rising A1c suggests she is at risk for developing diabetes.  Strongly recommend she lose 20 lbs this year following a glycemic index diet and particpate regularly in an aerobic  exercise activity.  We should check an A1c in 6 months.   Lab Results  Component Value Date   HGBA1C 6.1 02/22/2018          Relevant Orders   Hemoglobin A1c (Completed)   Comprehensive metabolic panel (Completed)    A total of 25 minutes of face to face time was spent with patient more than half of which was spent in counselling about the above mentioned conditions  and coordination of care   I am having Dodie C. Coltrane start on Zoster Vaccine Adjuvanted. I am also having her maintain her RA ALLERGY RELIEF, fluticasone, PULMICORT FLEXHALER, triamcinolone cream, albuterol, rosuvastatin, hydrochlorothiazide, and pantoprazole.  Meds ordered this encounter  Medications  . Zoster  Vaccine Adjuvanted [facility]) injection    Sig: Inject 0.5 mLs into the muscle once for 1 dose.    Dispense:  1 each    Refill:  1    There are no discontinued medications.  Follow-up: Return in about 6 months (around 08/24/2018).   Crecencio Mc, MD

## 2018-02-22 NOTE — Patient Instructions (Addendum)
Continue your current supplement amount OF 2000 IUs of D3 daily    Weigh yourself daily during the holidays;  It will help keep you on track    The ShingRx vaccine is now available in local pharmacies and is much more protective thant Zostavaxs,  It is therefore ADVISED for all interested adults over 50 to prevent shingles     Preventing Type 2 Diabetes Mellitus Type 2 diabetes (type 2 diabetes mellitus) is a long-term (chronic) disease that affects blood sugar (glucose) levels. Normally, a hormone called insulin allows glucose to enter cells in the body. The cells use glucose for energy. In type 2 diabetes, one or both of these problems may be present:  The body does not make enough insulin.  The body does not respond properly to insulin that it makes (insulin resistance).  Insulin resistance or lack of insulin causes excess glucose to build up in the blood instead of going into cells. As a result, high blood glucose (hyperglycemia) develops, which can cause many complications. Being overweight or obese and having an inactive (sedentary) lifestyle can increase your risk for diabetes. Type 2 diabetes can be delayed or prevented by making certain nutrition and lifestyle changes. What nutrition changes can be made?  Eat healthy meals and snacks regularly. Keep a healthy snack with you for when you get hungry between meals, such as fruit or a handful of nuts.  Eat lean meats and proteins that are low in saturated fats, such as chicken, fish, egg whites, and beans. Avoid processed meats.  Eat plenty of fruits and vegetables and plenty of grains that have not been processed (whole grains). It is recommended that you eat: ? 1?2 cups of fruit every day. ? 2?3 cups of vegetables every day. ? 6?8 oz of whole grains every day, such as oats, whole wheat, bulgur, brown rice, quinoa, and millet.  Eat low-fat dairy products, such as milk, yogurt, and cheese.  Eat foods that contain healthy fats,  such as nuts, avocado, olive oil, and canola oil.  Drink water throughout the day. Avoid drinks that contain added sugar, such as soda or sweet tea.  Follow instructions from your health care provider about specific eating or drinking restrictions.  Control how much food you eat at a time (portion size). ? Check food labels to find out the serving sizes of foods. ? Use a kitchen scale to weigh amounts of foods.  Saute or steam food instead of frying it. Cook with water or broth instead of oils or butter.  Limit your intake of: ? Salt (sodium). Have no more than 1 tsp (2,400 mg) of sodium a day. If you have heart disease or high blood pressure, have less than ? tsp (1,500 mg) of sodium a day. ? Saturated fat. This is fat that is solid at room temperature, such as butter or fat on meat. What lifestyle changes can be made?  Activity  Do moderate-intensity physical activity for at least 30 minutes on at least 5 days of the week, or as much as told by your health care provider.  Ask your health care provider what activities are safe for you. A mix of physical activities may be best, such as walking, swimming, cycling, and strength training.  Try to add physical activity into your day. For example: ? Park in spots that are farther away than usual, so that you walk more. For example, park in a far corner of the parking lot when you go to the office  or the grocery store. ? Take a walk during your lunch break. ? Use stairs instead of elevators or escalators. Weight Loss  Lose weight as directed. Your health care provider can determine how much weight loss is best for you and can help you lose weight safely.  If you are overweight or obese, you may be instructed to lose at least 5?7 % of your body weight. Alcohol and Tobacco   Limit alcohol intake to no more than 1 drink a day for nonpregnant women and 2 drinks a day for men. One drink equals 12 oz of beer, 5 oz of wine, or 1 oz of hard  liquor.  Do not use any tobacco products, such as cigarettes, chewing tobacco, and e-cigarettes. If you need help quitting, ask your health care provider. Work With [place] Provider  Have your blood glucose tested regularly, as told by your health care provider.  Discuss your risk factors and how you can reduce your risk for diabetes.  Get screening tests as told by your health care provider. You may have screening tests regularly, especially if you have certain risk factors for type 2 diabetes.  Make an appointment with a diet and nutrition specialist (registered dietitian). A registered dietitian can help you make a healthy eating plan and can help you understand portion sizes and food labels. Why are these changes important?  It is possible to prevent or delay type 2 diabetes and related health problems by making lifestyle and nutrition changes.  It can be difficult to recognize signs of type 2 diabetes. The best way to avoid possible damage to your body is to take actions to prevent the disease before you develop symptoms. What can happen if changes are not made?  Your blood glucose levels may keep increasing. Having high blood glucose for a long time is dangerous. Too much glucose in your blood can damage your blood vessels, heart, kidneys, nerves, and eyes.  You may develop prediabetes or type 2 diabetes. Type 2 diabetes can lead to many chronic health problems and complications, such as: ? Heart disease. ? Stroke. ? Blindness. ? Kidney disease. ? Depression. ? Poor circulation in the feet and legs, which could lead to surgical removal (amputation) in severe cases. Where to find support:  Ask your health care provider to recommend a registered dietitian, diabetes educator, or weight loss program.  Look for local or online weight loss groups.  Join a gym, fitness club, or outdoor activity group, such as a walking club. Where to find more information: To learn more  about diabetes and diabetes prevention, visit:  American Diabetes Association (ADA): www.diabetes.[employer] of Diabetes and Digestive and Kidney Diseases: FindSpin.nl  To learn more about healthy eating, visit:  The U.S. Department of Agriculture Scientist, research (physical sciences)), Choose My Plate: http://wiley-williams.com/  Office of Disease Prevention and Health Promotion (ODPHP), Dietary Guidelines: SurferLive.at  Summary  You can reduce your risk for type 2 diabetes by increasing your physical activity, eating healthy foods, and losing weight as directed.  Talk with your health care provider about your risk for type 2 diabetes. Ask about any blood tests or screening tests that you need to have. This information is not intended to replace advice given to you by your health care provider. Make sure you discuss any questions you have with your health care provider. Document Released: 06/28/2015 Document Revised: 08/12/2015 Document Reviewed: 04/27/2015 Elsevier Interactive Patient Education  [employer].

## 2018-02-24 NOTE — Assessment & Plan Note (Signed)
Lipoids are at goal on every other day dosing of crestor,   rx updated.  Lab Results  Component Value Date   CHOL 153 02/22/2018   HDL 69.60 02/22/2018   LDLCALC 69 02/22/2018   LDLDIRECT 81.0 08/19/2015   TRIG 72.0 02/22/2018   CHOLHDL 2 02/22/2018   Lab Results  Component Value Date   ALT 18 02/22/2018   AST 20 02/22/2018   ALKPHOS 31 (L) 02/22/2018   BILITOT 0.8 02/22/2018

## 2018-02-24 NOTE — Assessment & Plan Note (Signed)
diagnosed with prior sleep study but treatment has been deferred by patient.  Discussed the long term history of OSA , the risks of long term damage to heart and the signs and symptoms attributable to OSA.  Advised patient to consider  significant weight loss and/or use of CPAP.    

## 2018-02-24 NOTE — Assessment & Plan Note (Signed)
Well controlled on current regimen. Renal function stable, no changes today.  Lab Results  Component Value Date   CREATININE 0.96 02/22/2018   Lab Results  Component Value Date   NA 141 02/22/2018   K 3.6 02/22/2018   CL 101 02/22/2018   CO2 32 02/22/2018

## 2018-02-24 NOTE — Assessment & Plan Note (Signed)
Recurrent ,  Methylmalonic acid and intrinsic factor antibody ordered but not done.   Lab Results  Component Value Date   [id number] 121 08/23/2017

## 2018-02-24 NOTE — Assessment & Plan Note (Signed)
Rising A1c suggests she is at risk for developing diabetes.  Strongly recommend she lose 20 lbs this year following a glycemic index diet and particpate regularly in an aerobic  exercise activity.  We should check an A1c in 6 months.   Lab Results  Component Value Date   HGBA1C 6.1 02/22/2018

## 2018-02-24 NOTE — Assessment & Plan Note (Signed)
I have addressed  BMI and recommended wt loss of 10% of body weight over the next 6 months using a low glycemic index diet and regular exercise a minimum of 5 days per week.   

## 2018-03-07 ENCOUNTER — Other Ambulatory Visit: Payer: Self-pay

## 2018-03-07 MED ORDER — ROSUVASTATIN CALCIUM 5 MG PO TABS: 5.0000 mg | ORAL_TABLET | Freq: Every day | ORAL | 1 refills | Status: DC

## 2018-05-09 ENCOUNTER — Other Ambulatory Visit: Payer: Self-pay | Admitting: Internal Medicine

## 2018-06-04 ENCOUNTER — Other Ambulatory Visit: Payer: Self-pay | Admitting: Internal Medicine

## 2018-06-20 DIAGNOSIS — J4520 Mild intermittent asthma, uncomplicated: Secondary | ICD-10-CM | POA: Diagnosis not present

## 2018-06-20 DIAGNOSIS — G4733 Obstructive sleep apnea (adult) (pediatric): Secondary | ICD-10-CM | POA: Diagnosis not present

## 2018-06-20 DIAGNOSIS — E669 Obesity, unspecified: Secondary | ICD-10-CM | POA: Diagnosis not present

## 2018-07-11 ENCOUNTER — Other Ambulatory Visit: Payer: Self-pay | Admitting: Internal Medicine

## 2018-08-28 ENCOUNTER — Ambulatory Visit (INDEPENDENT_AMBULATORY_CARE_PROVIDER_SITE_OTHER): Payer: 59 | Admitting: Internal Medicine

## 2018-08-28 ENCOUNTER — Other Ambulatory Visit: Payer: Self-pay

## 2018-08-28 DIAGNOSIS — N289 Disorder of kidney and ureter, unspecified: Secondary | ICD-10-CM | POA: Diagnosis not present

## 2018-08-28 DIAGNOSIS — E785 Hyperlipidemia, unspecified: Secondary | ICD-10-CM | POA: Diagnosis not present

## 2018-08-28 DIAGNOSIS — R7303 Prediabetes: Secondary | ICD-10-CM | POA: Diagnosis not present

## 2018-08-28 DIAGNOSIS — J4520 Mild intermittent asthma, uncomplicated: Secondary | ICD-10-CM | POA: Diagnosis not present

## 2018-08-28 DIAGNOSIS — Z7189 Other specified counseling: Secondary | ICD-10-CM | POA: Diagnosis not present

## 2018-08-28 DIAGNOSIS — I10 Essential (primary) hypertension: Secondary | ICD-10-CM | POA: Diagnosis not present

## 2018-08-28 NOTE — Progress Notes (Signed)
Virtual Visit via [email or web address]  This visit type was conducted due to national recommendations for restrictions regarding the COVID-19 pandemic (e.g. social distancing).  This format is felt to be most appropriate for this patient at this time.  All issues noted in this document were discussed and addressed.  No physical exam was performed (except for noted visual exam findings with Video Visits).   I connected with@ on 08/28/18 at  9:30 AM EDT by a video enabled telemedicine application or telephone and verified that I am speaking with the correct person using two identifiers. Location patient: home Location provider: work or home office Persons participating in the virtual visit: patient, provider  I discussed the limitations, risks, security and privacy concerns of performing an evaluation and management service by telephone and the availability of in person appointments. I also discussed with the patient that there may be a patient responsible charge related to this service. The patient expressed understanding and agreed to proceed.  Reason for visit: follow up on hypertension , prediabetes, and  hyperlipidemia    HPI:   The patient has no signs or symptoms of COVID 19 infection (fever, cough, sore throat  or shortness of breath, anosmia, dysgeusia , myalgias, or diarrhea)  beyond what is typical for patient).  Patient denies contact with other persons with the above mentioned symptoms or with anyone confirmed to have COVID 19.  However, she reports that she had a prolonged illness in February after returning from a ski trip out [place] symptoms including nausea, vomiting and headache with a severe cough.   No testing for influenza or COVID 79  Was done as she recovered on her own without being seen .  Hypertension: patient checks blood pressure twice weekly at home.  Readings have been for the most part < 140/80 at rest . Patient is following a reduced salt diet most days and is taking medications as  prescribed.  She had a follow up with Dr Raul Del for mild intermittent asthma in April (no mention of illness in his note).    OSA:  She is not using CPAP and has no daytime symptoms of untreated OSA   The patient feels generally well, is walking several times per week for exercise and weighing weekly .  Following a carbohydrate modified diet 5 days per week. Denies any significant weight change, and has no symptoms suggestive of diabetes (polyuria, polyphagia, polydipsia).     She has had several tick bites this summer,  None have resulted in fevers and headaches or body aches.     ROS: See pertinent positives and negatives per HPI.  Past Medical History:  Diagnosis Date  . Allergy   . Arthritis   . Asthma   . Asthma, chronic 06/09/2014   Apparently diagnosed by Dr . Delilah Shan at [facility] many years ago with no pulmonology follow up sinc ethen.  Using flovent,  Which is his no longer covered by insurance.    Marland Kitchen GERD (gastroesophageal reflux disease)   . Hyperlipidemia   . Inflammatory polyps of colon ([place])   . Migraines   . Stroke ([place]) 1992  . UTI (urinary tract infection)     Past Surgical History:  Procedure Laterality Date  . ABDOMINAL HYSTERECTOMY  1996   endometriosis  . BILATERAL OOPHORECTOMY     endometriosis    Family History  Problem Relation Age of Onset  . Hyperlipidemia Mother   . Heart disease Mother   . Kidney disease Mother   .  Diabetes Mother   . Heart disease Father   . COPD Father   . Diabetes Maternal Grandmother   . Heart disease Maternal Grandfather   . Stroke Paternal Grandmother     SOCIAL HX: married,  Works full time 4 days per week at office,  1 at home    Current Outpatient Medications:  .  albuterol (PROVENTIL HFA;VENTOLIN HFA) 108 (90 Base) MCG/ACT inhaler, Inhale into the lungs., Disp: , Rfl:  .  fluticasone (FLONASE) 50 MCG/ACT nasal spray, instill 1 spray into each nostril twice a day, Disp: 16 g, Rfl: 5 .  hydrochlorothiazide  (HYDRODIURIL) 25 MG tablet, TAKE 1 TABLET BY MOUTH EVERY DAY, Disp: 90 tablet, Rfl: 1 .  pantoprazole (PROTONIX) 40 MG tablet, TAKE 1 TABLET(40 MG) BY MOUTH DAILY, Disp: 90 tablet, Rfl: 1 .  PULMICORT FLEXHALER 180 MCG/ACT inhaler, Inhale 180 mcg into the lungs as needed., Disp: , Rfl: 1 .  RA ALLERGY RELIEF 180 MG tablet, take 1 tablet by mouth once daily, Disp: 30 tablet, Rfl: 11 .  rosuvastatin (CRESTOR) 5 MG tablet, Take 1 tablet (5 mg total) by mouth daily., Disp: 90 tablet, Rfl: 1 .  triamcinolone cream (KENALOG) 0.1 %, Apply 1 application topically 2 (two) times daily., Disp: 30 g, Rfl: 0  EXAM:  VITALS per patient if applicable:  GENERAL: alert, oriented, appears well and in no acute distress  HEENT: atraumatic, conjunttiva clear, no obvious abnormalities on inspection of external nose and ears  NECK: normal movements of the head and neck  LUNGS: on inspection no signs of respiratory distress, breathing rate appears normal, no obvious gross SOB, gasping or wheezing  CV: no obvious cyanosis  MS: moves all visible extremities without noticeable abnormality  PSYCH/NEURO: pleasant and cooperative, no obvious depression or anxiety, speech and thought processing grossly intact  ASSESSMENT AND PLAN:  Discussed the following assessment and plan:  Mild intermittent asthma without complication No recent use of albuterol.  Sees fleming semi annually  Hypertension Well controlled on current regimen of hctz.  Renal function due for assessment , no changes today.  Lab Results  Component Value Date   CREATININE 0.96 02/22/2018   Lab Results  Component Value Date   NA 141 02/22/2018   K 3.6 02/22/2018   CL 101 02/22/2018   CO2 32 02/22/2018     Prediabetes  Her previous A1c suggest that she is at risk for developing diabetes.  Strongly recommend she lose 20 lbs this year following a glycemic index diet and participating regularly in an aerobic  exercise activity.  Advised to  return for an A1c  Lab Results  Component Value Date   HGBA1C 6.1 02/22/2018      Hyperlipidemia with target LDL less than 100 Lipids have been at goal on every other day dosing of crestor.  LFTS are due  Lab Results  Component Value Date   CHOL 153 02/22/2018   HDL 69.60 02/22/2018   LDLCALC 69 02/22/2018   LDLDIRECT 81.0 08/19/2015   TRIG 72.0 02/22/2018   CHOLHDL 2 02/22/2018   Lab Results  Component Value Date   ALT 18 02/22/2018   AST 20 02/22/2018   ALKPHOS 31 (L) 02/22/2018   BILITOT 0.8 02/22/2018     Educated About Covid-19 Virus Infection Educated patient on the newly broadened list of signs and symptoms of COVID-19 infection and ways to avoid the viral infection including washing hands frequently with soap and water,  using hand sanitizer if unable to wash,  avoiding touching face,  staying at home and limiting visitors,  and avoiding contact with people coming in and out of home.  Discussed the potential ineffectiveness of hand sanitizer if left in environments > 110 degrees (ie , the car).  Reminded patient to call office with questions/concerns.  The importance of social distancing was discussed today    I discussed the assessment and treatment plan with the patient. The patient was provided an opportunity to ask questions and all were answered. The patient agreed with the plan and demonstrated an understanding of the instructions.   The patient was advised to call back or seek an in-person evaluation if the symptoms worsen or if the condition fails to improve as anticipated.  I provided 25 minutes of non-face-to-face time during this encounter.   Crecencio Mc, MD

## 2018-08-31 ENCOUNTER — Encounter: Payer: Self-pay | Admitting: Internal Medicine

## 2018-08-31 NOTE — Assessment & Plan Note (Signed)
Lipids have been at goal on every other day dosing of crestor.  LFTS are due  Lab Results  Component Value Date   CHOL 153 02/22/2018   HDL 69.60 02/22/2018   LDLCALC 69 02/22/2018   LDLDIRECT 81.0 08/19/2015   TRIG 72.0 02/22/2018   CHOLHDL 2 02/22/2018   Lab Results  Component Value Date   ALT 18 02/22/2018   AST 20 02/22/2018   ALKPHOS 31 (L) 02/22/2018   BILITOT 0.8 02/22/2018

## 2018-08-31 NOTE — Assessment & Plan Note (Signed)

## 2018-08-31 NOTE — Assessment & Plan Note (Signed)
No recent use of albuterol.  Sees fleming semi annually

## 2018-08-31 NOTE — Assessment & Plan Note (Signed)
Well controlled on current regimen of hctz.  Renal function due for assessment , no changes today.  Lab Results  Component Value Date   CREATININE 0.96 02/22/2018   Lab Results  Component Value Date   NA 141 02/22/2018   K 3.6 02/22/2018   CL 101 02/22/2018   CO2 32 02/22/2018

## 2018-08-31 NOTE — Assessment & Plan Note (Signed)
Her previous A1c suggest that she is at risk for developing diabetes.  Strongly recommend she lose 20 lbs this year following a glycemic index diet and participating regularly in an aerobic  exercise activity.  Advised to return for an A1c  Lab Results  Component Value Date   HGBA1C 6.1 02/22/2018

## 2018-09-03 ENCOUNTER — Other Ambulatory Visit: Payer: Self-pay

## 2018-09-03 MED ORDER — ROSUVASTATIN CALCIUM 5 MG PO TABS: 5.0000 mg | ORAL_TABLET | Freq: Every day | ORAL | 1 refills | Status: DC

## 2018-09-10 DIAGNOSIS — D2262 Melanocytic nevi of left upper limb, including shoulder: Secondary | ICD-10-CM | POA: Diagnosis not present

## 2018-09-10 DIAGNOSIS — D2272 Melanocytic nevi of left lower limb, including hip: Secondary | ICD-10-CM | POA: Diagnosis not present

## 2018-09-10 DIAGNOSIS — D2271 Melanocytic nevi of right lower limb, including hip: Secondary | ICD-10-CM | POA: Diagnosis not present

## 2018-09-10 DIAGNOSIS — D225 Melanocytic nevi of trunk: Secondary | ICD-10-CM | POA: Diagnosis not present

## 2018-09-10 DIAGNOSIS — L7211 Pilar cyst: Secondary | ICD-10-CM | POA: Diagnosis not present

## 2018-09-10 DIAGNOSIS — D485 Neoplasm of uncertain behavior of skin: Secondary | ICD-10-CM | POA: Diagnosis not present

## 2018-09-10 DIAGNOSIS — D2261 Melanocytic nevi of right upper limb, including shoulder: Secondary | ICD-10-CM | POA: Diagnosis not present

## 2018-09-10 DIAGNOSIS — C44619 Basal cell carcinoma of skin of left upper limb, including shoulder: Secondary | ICD-10-CM | POA: Diagnosis not present

## 2018-09-12 ENCOUNTER — Other Ambulatory Visit (INDEPENDENT_AMBULATORY_CARE_PROVIDER_SITE_OTHER): Payer: 59

## 2018-09-12 ENCOUNTER — Other Ambulatory Visit: Payer: Self-pay

## 2018-09-12 DIAGNOSIS — I10 Essential (primary) hypertension: Secondary | ICD-10-CM

## 2018-09-12 DIAGNOSIS — R7303 Prediabetes: Secondary | ICD-10-CM | POA: Diagnosis not present

## 2018-09-12 DIAGNOSIS — E785 Hyperlipidemia, unspecified: Secondary | ICD-10-CM

## 2018-09-12 LAB — COMPREHENSIVE METABOLIC PANEL
ALT: 14 U/L (ref 0–35)
AST: 16 U/L (ref 0–37)
Albumin: 4 g/dL (ref 3.5–5.2)
Alkaline Phosphatase: 30 U/L — ABNORMAL LOW (ref 39–117)
BUN: 14 mg/dL (ref 6–23)
CO2: 32 mEq/L (ref 19–32)
Calcium: 9.4 mg/dL (ref 8.4–10.5)
Chloride: 103 mEq/L (ref 96–112)
Creatinine, Ser: 0.92 mg/dL (ref 0.40–1.20)
GFR: 60.87 mL/min (ref >60.00)
Glucose, Bld: 90 mg/dL (ref 70–99)
Potassium: 3.5 mEq/L (ref 3.5–5.1)
Sodium: 143 mEq/L (ref 135–145)
Total Bilirubin: 0.9 mg/dL (ref 0.2–1.2)
Total Protein: 6.4 g/dL (ref 6.0–8.3)

## 2018-09-12 LAB — HEMOGLOBIN A1C: Hgb A1c MFr Bld: 5.9 % (ref 4.6–6.5)

## 2018-09-12 LAB — LIPID PANEL
Cholesterol: 152 mg/dL (ref 0–200)
HDL: 63.4 mg/dL (ref >39.00)
LDL Cholesterol: 71 mg/dL (ref 0–99)
NonHDL: 88.94
Total CHOL/HDL Ratio: 2
Triglycerides: 89 mg/dL (ref 0.0–149.0)
VLDL: 17.8 mg/dL (ref 0.0–40.0)

## 2018-09-12 NOTE — Addendum Note (Signed)
Addended by: Leeanne Rio on: 09/12/2018 09:24 AM   Modules accepted: Orders

## 2018-09-13 ENCOUNTER — Other Ambulatory Visit (INDEPENDENT_AMBULATORY_CARE_PROVIDER_SITE_OTHER): Payer: 59

## 2018-09-13 DIAGNOSIS — R7303 Prediabetes: Secondary | ICD-10-CM | POA: Diagnosis not present

## 2018-09-13 LAB — MICROALBUMIN / CREATININE URINE RATIO
Creatinine,U: 74.7 mg/dL
Microalb Creat Ratio: 0.9 mg/g (ref 0.0–30.0)
Microalb, Ur: <0.7 mg/dL (ref 0.0–1.9)

## 2018-09-16 DIAGNOSIS — C44619 Basal cell carcinoma of skin of left upper limb, including shoulder: Secondary | ICD-10-CM | POA: Diagnosis not present

## 2018-11-04 ENCOUNTER — Other Ambulatory Visit: Payer: Self-pay

## 2018-11-04 MED ORDER — HYDROCHLOROTHIAZIDE 25 MG PO TABS: 25.0000 mg | ORAL_TABLET | Freq: Every day | ORAL | 1 refills | Status: DC

## 2019-01-09 DIAGNOSIS — Z9289 Personal history of other medical treatment: Secondary | ICD-10-CM | POA: Diagnosis not present

## 2019-01-09 DIAGNOSIS — Z1231 Encounter for screening mammogram for malignant neoplasm of breast: Secondary | ICD-10-CM | POA: Diagnosis not present

## 2019-01-13 ENCOUNTER — Other Ambulatory Visit: Payer: Self-pay

## 2019-01-13 ENCOUNTER — Ambulatory Visit (INDEPENDENT_AMBULATORY_CARE_PROVIDER_SITE_OTHER): Payer: 59

## 2019-01-13 DIAGNOSIS — Z23 Encounter for immunization: Secondary | ICD-10-CM

## 2019-01-14 ENCOUNTER — Other Ambulatory Visit: Payer: Self-pay

## 2019-01-14 MED ORDER — PANTOPRAZOLE SODIUM 40 MG PO TBEC: DELAYED_RELEASE_TABLET | ORAL | 1 refills | Status: DC

## 2019-01-17 DIAGNOSIS — Z08 Encounter for follow-up examination after completed treatment for malignant neoplasm: Secondary | ICD-10-CM | POA: Diagnosis not present

## 2019-01-17 DIAGNOSIS — L821 Other seborrheic keratosis: Secondary | ICD-10-CM | POA: Diagnosis not present

## 2019-01-17 DIAGNOSIS — D2261 Melanocytic nevi of right upper limb, including shoulder: Secondary | ICD-10-CM | POA: Diagnosis not present

## 2019-01-17 DIAGNOSIS — D2262 Melanocytic nevi of left upper limb, including shoulder: Secondary | ICD-10-CM | POA: Diagnosis not present

## 2019-01-17 DIAGNOSIS — Z85828 Personal history of other malignant neoplasm of skin: Secondary | ICD-10-CM | POA: Diagnosis not present

## 2019-01-17 DIAGNOSIS — D2272 Melanocytic nevi of left lower limb, including hip: Secondary | ICD-10-CM | POA: Diagnosis not present

## 2019-01-17 DIAGNOSIS — D2271 Melanocytic nevi of right lower limb, including hip: Secondary | ICD-10-CM | POA: Diagnosis not present

## 2019-01-17 DIAGNOSIS — D225 Melanocytic nevi of trunk: Secondary | ICD-10-CM | POA: Diagnosis not present

## 2019-03-03 ENCOUNTER — Other Ambulatory Visit: Payer: Self-pay

## 2019-03-03 MED ORDER — ROSUVASTATIN CALCIUM 5 MG PO TABS: 5.0000 mg | ORAL_TABLET | Freq: Every day | ORAL | 3 refills | Status: DC

## 2019-05-05 ENCOUNTER — Other Ambulatory Visit: Payer: Self-pay

## 2019-05-05 MED ORDER — HYDROCHLOROTHIAZIDE 25 MG PO TABS: 25.0000 mg | ORAL_TABLET | Freq: Every day | ORAL | 1 refills | Status: DC

## 2019-07-16 ENCOUNTER — Other Ambulatory Visit: Payer: Self-pay

## 2019-07-16 NOTE — Patient Outreach (Signed)
[place] [facility]) Care Management  07/16/2019  Donna Richards 07-Jun-1950 [id number]   [employer] Hy[employer] Referral source: Insurance company  Reviewed medical record.   Placed call to patient with no answer. Left a message requesting a call back.  PLAN: will mail outreach letter and call back in 3 days if no response.  [name], RN, BSN, CEN [facility] [employer] [phone]

## 2019-07-16 NOTE — Patient Outreach (Addendum)
[place] [facility]) Care Management  07/16/2019  Donna Richards Aug 25, 1950 [id number]   [employer] Hy[employer]ension [employer]: Incoming call from patient. Reviewed reason for call.  Patient reports that she takes a HCTZ or blood pressure. Reports this medication was initially started for swelling in feet. Reports she does not monitor at home and has not been told to.  Reports she also has asthma and allergies which is under good control. Reports history of GERD as well.  Reports she is taking all her medications as prescribed and does not have any difficulty taking her medications. Reports she can afford her medications.   PLAN: reviewed with patient, plan to mail a BP cuff and I would like her to monitor her BP 2 times per week. We agreed to follow up call in 3 weeks.  Will mail new patient packet and EMMI education.  [name], RN, BSN, CEN [facility] [employer] [phone]

## 2019-07-21 ENCOUNTER — Ambulatory Visit: Payer: 59

## 2019-08-06 ENCOUNTER — Other Ambulatory Visit: Payer: Self-pay

## 2019-08-06 NOTE — Patient Outreach (Signed)
[place] [facility]) Care Management  08/06/2019  Donna Richards [birth year] [id number]   Telephone assessment:  Placed call to patient who reports she is doing well. Reports she did get her monitor but has not opened box and started to use it.  Reviewed with patient that I would call her back in 1 week. She agreed to open box and monitor BP at least 2 times.  PLAN: call back in 1 week to review BP readings an complete assessments.   [name], RN, BSN, CEN [facility] [employer] [phone]

## 2019-08-07 ENCOUNTER — Other Ambulatory Visit: Payer: Self-pay

## 2019-08-07 MED ORDER — PANTOPRAZOLE SODIUM 40 MG PO TBEC: DELAYED_RELEASE_TABLET | ORAL | 0 refills | Status: DC

## 2019-08-13 ENCOUNTER — Other Ambulatory Visit: Payer: Self-pay

## 2019-08-13 NOTE — Patient Outreach (Signed)
[place] [facility]) Care Management   08/13/2019  Donna Richards [birth year] [id number]  Donna Richards is an 69 y.o. female Telephone assessment Subject: Patient reports she is self monitoring daily. Reports first time using cuff her skin got pinched and she had a bruise. Reports she is now able to self monitor without difficulty.  Reports no other current concerns about her health.   Ob jective:  Awake and alert. Pleasant to talk with on the phone. Talking in complete sentences without any difficulty.  Today's Vitals   08/13/19 1545  Weight: 207 lb (93.9 kg)  Height: 1.651 m (5' 5" )  PainSc: 0-No pain     Encounter Medications:   Outpatient Encounter Medications as of 08/13/2019  Medication Sig Note  . aspirin EC 81 MG tablet Take 81 mg by mouth every other day.   . cholecalciferol (VITAMIN D3) 25 MCG (1000 UNIT) tablet Take 1,000 Units by mouth daily.   . fluticasone (FLONASE) 50 MCG/ACT nasal spray instill 1 spray into each nostril twice a day   . hydrochlorothiazide (HYDRODIURIL) 25 MG tablet Take 1 tablet (25 mg total) by mouth daily.   . pantoprazole (PROTONIX) 40 MG tablet TAKE 1 TABLET(40 MG) BY MOUTH DAILY   . PULMICORT FLEXHALER 180 MCG/ACT inhaler Inhale 180 mcg into the lungs as needed. 08/19/2015: Received from: External Pharmacy Received Sig:   . RA ALLERGY RELIEF 180 MG tablet take 1 tablet by mouth once daily   . rosuvastatin (CRESTOR) 5 MG tablet Take 1 tablet (5 mg total) by mouth daily.   Marland Kitchen albuterol (PROVENTIL HFA;VENTOLIN HFA) 108 (90 Base) MCG/ACT inhaler Inhale into the lungs.   . triamcinolone cream (KENALOG) 0.1 % Apply 1 application topically 2 (two) times daily. (Patient not taking: Reported on 08/13/2019)    No facility-administered encounter medications on file as of 08/13/2019.    Functional Status:   In your present state of health, do you have any difficulty performing the following activities: 08/13/2019  Hearing? N  Vision? N  Difficulty  concentrating or making decisions? N  Walking or climbing stairs? N  Dressing or bathing? N  Doing errands, shopping? N  Preparing Food and eating ? N  Using the Toilet? N  In the past six months, have you accidently leaked urine? N  Do you have problems with loss of bowel control? N  Managing your Medications? N  Managing your Finances? N  Housekeeping or managing your Housekeeping? N  Some recent data might be hidden    Fall/Depression Screening:    Fall Risk  08/13/2019 08/31/2018 02/22/2018  Falls in the past year? 0 0 0   PHQ 2/9 Scores 08/13/2019 02/22/2018 08/23/2016  PHQ - 2 Score 0 0 0  PHQ- 9 Score - 1 1    Assessment:   (1) has BP cuff and is monitoring daily.   (2) no advanced directives (3) no current exercise plan (4) not following a low salt diet at this time.   Plan:  (1) Reviewed with patient normal BP readings and when to call MD for increased BP. Encouraged patient to record BP on log to review with Md at next office visit. (2) Reviewed importance of advanced directives. Offered to mail a packet, however patient prefers to use a packet where she works and will get assistance from Education officer, museum to complete document. (3) reviewed importance of exercise and agrees to start exercise 3 times per week. (4) reviewed importance of low salt diet. Reviewed with patient  to avoid salt shaker and use a salt substitute.   Will plan follow up in 4 weeks. Encouraged patient to call me sooner if needed.  THN CM Care Plan Problem One     Most Recent Value  Care Plan Problem One  History of hypertension and not self monitoring  Role Documenting the Problem One  Care Management Coordinator  Care Plan for Problem One  Active  THN Long Term Goal   Patient will verbalize normal blood pressure range when self monitoring for the next 90 days.  THN Long Term Goal Start Date  07/16/19  Interventions for Problem One Long Term Goal  Reviewed current BP readings in the last week.  reviewed  understanding of how to use BP cuff.   THN CM Short Term Goal #1   Patient will self monitor blood pressure 3 times per week and record for 30 days.   THN CM Short Term Goal #1 Start Date  07/16/19  Interventions for Short Term Goal #1  reviewed importance of BP monitoring.  Reccomend 1 reading on a weekend and 1 reading in the am and 1 reading in the pm.  Reviewed normal BP readings. Suggested patient call MD for trends up in BP.    THN CM Short Term Goal #2   Patient will verbalize recieivingher BP monitor in the mail in the next 30 days.   THN CM Short Term Goal #2 Start Date  07/16/19  [facility] CM Short Term Goal #2 Met Date  08/13/19  THN CM Short Term Goal #3  Patient will verbalize exercising for 15 minutes 3 times per week for the next 30 days.   THN CM Short Term Goal #3 Start Date  08/13/19  Interventions for Short Tern Goal #3  reviewed benefits of exercise on BP.  reviewed brisk walk early am or late pm to avoid heat.      This note and barrier letter sent to MD.  [name], RN, BSN, CEN [place] Coordinator [phone]

## 2019-08-26 DIAGNOSIS — J310 Chronic rhinitis: Secondary | ICD-10-CM | POA: Diagnosis not present

## 2019-08-26 DIAGNOSIS — G4733 Obstructive sleep apnea (adult) (pediatric): Secondary | ICD-10-CM | POA: Diagnosis not present

## 2019-08-26 DIAGNOSIS — J4520 Mild intermittent asthma, uncomplicated: Secondary | ICD-10-CM | POA: Diagnosis not present

## 2019-09-10 ENCOUNTER — Other Ambulatory Visit: Payer: Self-pay

## 2019-09-10 NOTE — Patient Outreach (Signed)
[place] [facility]) Care Management  09/10/2019  Donna Richards [birth year] [id number]   Telephone assessment:  1 month follow up  Placed call to patient with no answer.  Left a message and requested a call back.  PLAN: placed follow up all again in 3 days if no response.  [name], RN, BSN, CEN [facility] [employer] [phone]

## 2019-09-12 ENCOUNTER — Other Ambulatory Visit: Payer: Self-pay

## 2019-09-12 NOTE — Patient Outreach (Signed)
[place] [facility]) Care Management  09/12/2019  Donna Richards 1950-05-27 [id number]   Telephone assessment: Patient returned call and left me a message. I placed call back to patient with no answer and left patient a message.   PLAN: will await a call back or will reattempt in 3 days.  [name], RN, BSN, CEN [facility] [employer] [phone]

## 2019-09-15 ENCOUNTER — Other Ambulatory Visit: Payer: Self-pay

## 2019-09-15 NOTE — Patient Outreach (Signed)
[place] [facility]) Care Management  09/15/2019  Donna Richards 1950-08-22 [id number]   Monthly follow up:  Placed call to patient with no answer.    PLAN: will call back in 3 days.  [name], RN, BSN, CEN [facility] [employer] [phone]

## 2019-09-17 ENCOUNTER — Other Ambulatory Visit: Payer: Self-pay

## 2019-09-18 ENCOUNTER — Other Ambulatory Visit: Payer: Self-pay

## 2019-09-18 NOTE — Patient Outreach (Signed)
[place] [facility]) Care Management  09/18/2019  Donna Richards Jun 19, 1950 [id number]  Telephone assessment: Placed call to patient with no answer.  PLAN: will mail unsuccessful outreach letter. Will attempt another call in 1 week.  [name], RN, BSN, CEN [facility] [employer] [phone]

## 2019-09-25 ENCOUNTER — Other Ambulatory Visit: Payer: Self-pay

## 2019-09-26 NOTE — Patient Outreach (Signed)
[place] [facility]) Care Management  09/26/2019  Donna Richards [birth year] [id number]    Telephone follow up: Attempted outreach on 6/23,                                       6/25                                       6/28                                       7/1                                       7/9  Without successful. Outreach letter mailed.   PLAN:will wait 7 more days and if no response to telephone outreaches or mailed letter will plan to close on 7/15/021  [name], RN, BSN, [place] Coordinator [phone]

## 2019-10-02 ENCOUNTER — Other Ambulatory Visit: Payer: Self-pay

## 2019-10-02 NOTE — Patient Outreach (Signed)
[place] [facility]) Care Management  10/02/2019  Donna Richards February 04, 1951 [id number]    Telephone assessment:  Placed call to patient to follow up on hypertension. No answer.Left a message requesting a call back.  PLAN:will call back in 3 days. [name], RN, BSN, CEN [facility] [employer] [phone]

## 2019-10-06 ENCOUNTER — Other Ambulatory Visit: Payer: Self-pay

## 2019-10-06 NOTE — Patient Outreach (Signed)
[place] [facility]) Care Management  10/06/2019  Donna Richards 1950/04/19 [id number]   Case closure:  No response to letter or telephone outreaches.   PLAN: case closure as unable to maintain contact.  [name], RN, BSN, CEN [facility] [employer] [phone]

## 2019-10-21 ENCOUNTER — Other Ambulatory Visit: Payer: Self-pay | Admitting: Internal Medicine

## 2019-10-23 ENCOUNTER — Other Ambulatory Visit: Payer: Self-pay

## 2019-10-23 MED ORDER — PANTOPRAZOLE SODIUM 40 MG PO TBEC: DELAYED_RELEASE_TABLET | ORAL | 1 refills | Status: DC

## 2019-12-02 ENCOUNTER — Ambulatory Visit (INDEPENDENT_AMBULATORY_CARE_PROVIDER_SITE_OTHER): Payer: Medicare HMO | Admitting: Internal Medicine

## 2019-12-02 ENCOUNTER — Other Ambulatory Visit: Payer: Self-pay

## 2019-12-02 ENCOUNTER — Encounter: Payer: Self-pay | Admitting: Internal Medicine

## 2019-12-02 ENCOUNTER — Ambulatory Visit (INDEPENDENT_AMBULATORY_CARE_PROVIDER_SITE_OTHER): Payer: Medicare HMO

## 2019-12-02 VITALS — BP 126/74 | HR 96 | Temp 98.3°F | Resp 16 | Ht 65.0 in | Wt 205.0 lb

## 2019-12-02 DIAGNOSIS — Z1231 Encounter for screening mammogram for malignant neoplasm of breast: Secondary | ICD-10-CM

## 2019-12-02 DIAGNOSIS — R072 Precordial pain: Secondary | ICD-10-CM

## 2019-12-02 DIAGNOSIS — E669 Obesity, unspecified: Secondary | ICD-10-CM | POA: Diagnosis not present

## 2019-12-02 DIAGNOSIS — R9431 Abnormal electrocardiogram [ECG] [EKG]: Secondary | ICD-10-CM

## 2019-12-02 DIAGNOSIS — I10 Essential (primary) hypertension: Secondary | ICD-10-CM | POA: Diagnosis not present

## 2019-12-02 DIAGNOSIS — R7303 Prediabetes: Secondary | ICD-10-CM | POA: Diagnosis not present

## 2019-12-02 DIAGNOSIS — R079 Chest pain, unspecified: Secondary | ICD-10-CM | POA: Diagnosis not present

## 2019-12-02 DIAGNOSIS — Z0000 Encounter for general adult medical examination without abnormal findings: Secondary | ICD-10-CM | POA: Diagnosis not present

## 2019-12-02 DIAGNOSIS — R1011 Right upper quadrant pain: Secondary | ICD-10-CM | POA: Diagnosis not present

## 2019-12-02 NOTE — Assessment & Plan Note (Signed)
Well controlled on current regimen of hctz  Renal function is due ,  no changes today.

## 2019-12-02 NOTE — Progress Notes (Addendum)
Patient ID: Donna Richards, female    DOB: Jun 14, 1950  Age: 69 y.o. MRN: [id number]  The patient is here for annual preventive  examination and management of other chronic and acute problems.  This visit occurred during the SARS-CoV-2 public health emergency.  Safety protocols were in place, including screening questions prior to the visit, additional usage of staff PPE, and extensive cleaning of exam room while observing appropriate contact time as indicated for disinfecting solutions.    Patient has received NO  doses of the available COVID 19 vaccines due to concern about the mandatory nature and the effect of non FDA approved mRNA vaccines on future health .   Patient continues to mask when outside of the home except when walking in yard or at safe distances from others .  Patient denies any change in mood or development of unhealthy behaviors resuting from the pandemic's restriction of activities and socialization.  ,  The risk factors are reflected in the social history.  The roster of all physicians providing medical care to patient - is listed in the Snapshot section of the chart.  Activities of daily living:  The patient is 100% independent in all ADLs: dressing, toileting, feeding as well as independent mobility  Home safety : The patient has smoke detectors in the home. They wear seatbelts.  There are no firearms at home. There is no violence in the home.   There is no risks for hepatitis, STDs or HIV. There is no   history of blood transfusion. They have no travel history to infectious disease endemic areas of the world.  The patient has seen their dentist in the last six month. They have seen their eye doctor in the last year. They admit to slight hearing difficulty with regard to whispered voices and some television programs.  They have deferred audiologic testing in the last year.  They do not  have excessive sun exposure. Discussed the need for sun protection: hats, long sleeves  and use of sunscreen if there is significant sun exposure.   Diet: the importance of a healthy diet is discussed. They do have a healthy diet.  The benefits of regular aerobic exercise were discussed. She walks 4 times per week ,  20 minutes.   Depression screen: there are no signs or vegative symptoms of depression- irritability, change in appetite, anhedonia, sadness/tearfullness.  Cognitive assessment: the patient manages all their financial and personal affairs and is actively engaged. They could relate day,date,year and events; recalled 2/3 objects at 3 minutes; performed clock-face test normally.  The following portions of the patient's history were reviewed and updated as appropriate: allergies, current medications, past family history, past medical history,  past surgical history, past social history  and problem list.  Visual acuity was not assessed per patient preference since she has regular follow up with her ophthalmologist. Hearing and body mass index were assessed and reviewed.   During the course of the visit the patient was educated and counseled about appropriate screening and preventive services including : fall prevention , diabetes screening, nutrition counseling, colorectal cancer screening, and recommended immunizations.    CC: The primary encounter diagnosis was Postprandial RUQ pain. Diagnoses of Substernal chest pain, Breast cancer screening by mammogram, Essential hypertension, Prediabetes, Obesity (BMI 35-45.6), Colicky RUQ abdominal pain, Visit for preventive health examination, and Abnormal EKG were also pertinent to this visit.  1) not sleeping well,  Needs  To have another sleep study by pulmonology  2) 3 episodes  In the last 30 days  of RUQ pain that radiates to substernal area/epigastric area, occurring at rest and after eating.  No nausea  Some jaw pain but attributed that to TMJ   History Claudie has a past medical history of Allergy, Arthritis, Asthma,  Asthma, chronic (06/09/2014), GERD (gastroesophageal reflux disease), Hyperlipidemia, Inflammatory polyps of colon ([place]), Migraines, Stroke ([place]) (1992), and UTI (urinary tract infection).   She has a past surgical history that includes Bilateral oophorectomy and Abdominal hysterectomy (1996).   Her family history includes COPD in her father; Diabetes in her maternal grandmother and mother; Heart disease in her father, maternal grandfather, and mother; Hyperlipidemia in her mother; Kidney disease in her mother; Stroke in her paternal grandmother.She reports that she has never smoked. She has never used smokeless tobacco. She reports that she does not drink alcohol and does not use drugs.  Outpatient Medications Prior to Visit  Medication Sig Dispense Refill  . albuterol (PROVENTIL HFA;VENTOLIN HFA) 108 (90 Base) MCG/ACT inhaler Inhale into the lungs.    Marland Kitchen aspirin EC 81 MG tablet Take 81 mg by mouth every other day.    . cholecalciferol (VITAMIN D3) 25 MCG (1000 UNIT) tablet Take 1,000 Units by mouth daily.    . fluticasone (FLONASE) 50 MCG/ACT nasal spray instill 1 spray into each nostril twice a day 16 g 5  . hydrochlorothiazide (HYDRODIURIL) 25 MG tablet TAKE 1 TABLET(25 MG) BY MOUTH DAILY 90 tablet 1  . pantoprazole (PROTONIX) 40 MG tablet TAKE 1 TABLET(40 MG) BY MOUTH DAILY 90 tablet 1  . PULMICORT FLEXHALER 180 MCG/ACT inhaler Inhale 180 mcg into the lungs as needed.  1  . RA ALLERGY RELIEF 180 MG tablet take 1 tablet by mouth once daily 30 tablet 11  . rosuvastatin (CRESTOR) 5 MG tablet Take 1 tablet (5 mg total) by mouth daily. 90 tablet 3  . triamcinolone cream (KENALOG) 0.1 % Apply 1 application topically 2 (two) times daily. (Patient not taking: Reported on 08/13/2019) 30 g 0   No facility-administered medications prior to visit.    Review of Systems  Objective:  BP 126/74 (BP Location: Left Arm, Patient Position: Sitting, Cuff Size: Normal)   Pulse 96   Temp 98.3 F (36.8 C)  (Oral)   Resp 16   Ht 5\' 5"  (1.651 m)   Wt 205 lb (93 kg)   SpO2 96%   BMI 34.11 kg/m   Physical Exam    Assessment & Plan:   Problem List Items Addressed This Visit      Unprioritized   Obesity (BMI 30-39.9)    I have addressed  BMI and recommended wt loss of 10% of body weigh over the next 6 months using a low glycemic index diet and regular exercise a minimum of 5 days per week.        Visit for preventive health examination    age appropriate education and counseling updated, referrals for preventative services and immunizations addressed, dietary and smoking counseling addressed, most recent labs reviewed.  I have personally reviewed and have noted:  1) the patient's medical and social history 2) The pt's use of alcohol, tobacco, and illicit drugs 3) The patient's current medications and supplements 4) Functional ability including ADL's, fall risk, home safety risk, hearing and visual impairment 5) Diet and physical activities 6) Evidence for depression or mood disorder 7) The patient's height, weight, and BMI have been recorded in the chart  I have made referrals, and provided counseling and education  based on review of the above      Prediabetes   Relevant Orders   Hemoglobin A1c (Completed)   Hypertension    Well controlled on current regimen of hctz  Renal function is due ,  no changes today.      Relevant Orders   Basic metabolic panel (Completed)   Lipid panel (Completed)   Colicky RUQ abdominal pain    Workup for GB disease, pancreatitis  and heart disease under way       Abnormal EKG    Her  chest x ray is normal. I have ordered and reviewed a 12 lead EKG and find that there are no acute changes and patient is in sinus rhythm.   EKG was not completely normal; , however.   LA enlargement  LAFB noted.  There are no priors for comparison.  If the ultrasound does not reveal a gallbladder problem,  I will recommend an evaluation by cardiology.          Other Visit Diagnoses    Postprandial RUQ pain    -  Primary   Relevant Orders   US Abdomen Limited RUQ   Hepatic function panel (Completed)   Substernal chest pain       Relevant Orders   EKG 12-Lead (Completed)   DG Chest 2 View (Completed)   Lipase (Completed)   Breast cancer screening by mammogram       Relevant Orders   MM DIGITAL SCREENING BILATERAL      I am having Kriste C. Gelin maintain her RA Allergy Relief, fluticasone, Pulmicort Flexhaler, triamcinolone cream, albuterol, rosuvastatin, cholecalciferol, aspirin EC, hydrochlorothiazide, and pantoprazole.  No orders of the defined types were placed in this encounter.   There are no discontinued medications.  Follow-up: No follow-ups on file.   Crecencio Mc, MD

## 2019-12-02 NOTE — Patient Instructions (Addendum)
Consider the [employer]  Diet to help you lose weight  Your chest pain and Right sided abdominal  is under workup or :  Gallbladder disease Angina (heart pain)  Your mammogram has been ordered   Your constipation may be due to   Inadequate water (60 ounces daily is your goal) Inadequate fiber (25  Grams,  Or a serving of miralax or citrucel daily)    Health Maintenance After Age 68 After age 71, you are at a higher risk for certain long-term diseases and infections as well as injuries from falls. Falls are a major cause of broken bones and head injuries in people who are older than age 21. Getting regular preventive care can help to keep you healthy and well. Preventive care includes getting regular testing and making lifestyle changes as recommended by your health care provider. Talk with your health care provider about:  Which screenings and tests you should have. A screening is a test that checks for a disease when you have no symptoms.  A diet and exercise plan that is right for you. What should I know about screenings and tests to prevent falls? Screening and testing are the best ways to find a health problem early. Early diagnosis and treatment give you the best chance of managing medical conditions that are common after age 12. Certain conditions and lifestyle choices may make you more likely to have a fall. Your health care provider may recommend:  Regular vision checks. Poor vision and conditions such as cataracts can make you more likely to have a fall. If you wear glasses, make sure to get your prescription updated if your vision changes.  Medicine review. Work with your health care provider to regularly review all of the medicines you are taking, including over-the-counter medicines. Ask your health care provider about any side effects that may make you more likely to have a fall. Tell your health care provider if any medicines that you take make you feel dizzy or  sleepy.  Osteoporosis screening. Osteoporosis is a condition that causes the bones to get weaker. This can make the bones weak and cause them to break more easily.  Blood pressure screening. Blood pressure changes and medicines to control blood pressure can make you feel dizzy.  Strength and balance checks. Your health care provider may recommend certain tests to check your strength and balance while standing, walking, or changing positions.  Foot health exam. Foot pain and numbness, as well as not wearing proper footwear, can make you more likely to have a fall.  Depression screening. You may be more likely to have a fall if you have a fear of falling, feel emotionally low, or feel unable to do activities that you used to do.  Alcohol use screening. Using too much alcohol can affect your balance and may make you more likely to have a fall. What actions can I take to lower my risk of falls? General instructions  Talk with your health care provider about your risks for falling. Tell your health care provider if: ? You fall. Be sure to tell your health care provider about all falls, even ones that seem minor. ? You feel dizzy, sleepy, or off-balance.  Take over-the-counter and prescription medicines only as told by your health care provider. These include any supplements.  Eat a healthy diet and maintain a healthy weight. A healthy diet includes low-fat dairy products, low-fat (lean) meats, and fiber from whole grains, beans, and lots of fruits and vegetables. Home  safety  Remove any tripping hazards, such as rugs, cords, and clutter.  Install safety equipment such as grab bars in bathrooms and safety rails on stairs.  Keep rooms and walkways well-lit. Activity   Follow a regular exercise program to stay fit. This will help you maintain your balance. Ask your health care provider what types of exercise are appropriate for you.  If you need a cane or walker, use it as recommended by  your health care provider.  Wear supportive shoes that have nonskid soles. Lifestyle  Do not drink alcohol if your health care provider tells you not to drink.  If you drink alcohol, limit how much you have: ? 0-1 drink a day for women. ? 0-2 drinks a day for men.  Be aware of how much alcohol is in your drink. In the [place] one drink equals one typical bottle of beer (12 oz), one-half glass of wine (5 oz), or one shot of hard liquor (1 oz).  Do not use any products that contain nicotine or tobacco, such as cigarettes and e-cigarettes. If you need help quitting, ask your health care provider. Summary  Having a healthy lifestyle and getting preventive care can help to protect your health and wellness after age 30.  Screening and testing are the best way to find a health problem early and help you avoid having a fall. Early diagnosis and treatment give you the best chance for managing medical conditions that are more common for people who are older than age 56.  Falls are a major cause of broken bones and head injuries in people who are older than age 9. Take precautions to prevent a fall at home.  Work with your health care provider to learn what changes you can make to improve your health and wellness and to prevent falls. This information is not intended to replace advice given to you by your health care provider. Make sure you discuss any questions you have with your health care provider. Document Revised: 06/27/2018 Document Reviewed: 01/17/2017 Elsevier Patient Education  2020 [employer].

## 2019-12-02 NOTE — Assessment & Plan Note (Signed)

## 2019-12-02 NOTE — Assessment & Plan Note (Signed)
I have addressed  BMI and recommended wt loss of 10% of body weigh over the next 6 months using a low glycemic index diet and regular exercise a minimum of 5 days per week.   

## 2019-12-02 NOTE — Assessment & Plan Note (Signed)
Workup for GB disease, pancreatitis  and heart disease under way

## 2019-12-03 LAB — HEPATIC FUNCTION PANEL
ALT: 23 U/L (ref 0–35)
AST: 23 U/L (ref 0–37)
Albumin: 4.5 g/dL (ref 3.5–5.2)
Alkaline Phosphatase: 35 U/L — ABNORMAL LOW (ref 39–117)
Bilirubin, Direct: 0.2 mg/dL (ref 0.0–0.3)
Total Bilirubin: 1 mg/dL (ref 0.2–1.2)
Total Protein: 7.1 g/dL (ref 6.0–8.3)

## 2019-12-03 LAB — BASIC METABOLIC PANEL
BUN: 15 mg/dL (ref 6–23)
CO2: 33 mEq/L — ABNORMAL HIGH (ref 19–32)
Calcium: 10 mg/dL (ref 8.4–10.5)
Chloride: 99 mEq/L (ref 96–112)
Creatinine, Ser: 0.88 mg/dL (ref 0.40–1.20)
GFR: 63.84 mL/min (ref >60.00)
Glucose, Bld: 90 mg/dL (ref 70–99)
Potassium: 3.7 mEq/L (ref 3.5–5.1)
Sodium: 140 mEq/L (ref 135–145)

## 2019-12-03 LAB — HEMOGLOBIN A1C: Hgb A1c MFr Bld: 6 % (ref 4.6–6.5)

## 2019-12-03 LAB — LIPID PANEL
Cholesterol: 162 mg/dL (ref 0–200)
HDL: 73.6 mg/dL (ref >39.00)
LDL Cholesterol: 74 mg/dL (ref 0–99)
NonHDL: 88.78
Total CHOL/HDL Ratio: 2
Triglycerides: 75 mg/dL (ref 0.0–149.0)
VLDL: 15 mg/dL (ref 0.0–40.0)

## 2019-12-03 LAB — LIPASE: Lipase: 27 U/L (ref 11.0–59.0)

## 2019-12-04 ENCOUNTER — Encounter: Payer: Self-pay | Admitting: Internal Medicine

## 2019-12-04 DIAGNOSIS — R9431 Abnormal electrocardiogram [ECG] [EKG]: Secondary | ICD-10-CM | POA: Insufficient documentation

## 2019-12-04 NOTE — Assessment & Plan Note (Addendum)
Her  chest x ray is normal. I have ordered and reviewed a 12 lead EKG and find that there are no acute changes and patient is in sinus rhythm.   EKG was not completely normal; , however.   LA enlargement  LAFB noted.  There are no priors for comparison.  If the ultrasound does not reveal a gallbladder problem,  I will recommend an evaluation by cardiology.

## 2019-12-10 ENCOUNTER — Ambulatory Visit
Admission: RE | Admit: 2019-12-10 | Discharge: 2019-12-10 | Disposition: A | Discharge status: Home / Self Care | Payer: Medicare HMO | Source: Ambulatory Visit | Attending: Internal Medicine | Admitting: Internal Medicine

## 2019-12-10 ENCOUNTER — Other Ambulatory Visit: Payer: Self-pay

## 2019-12-10 DIAGNOSIS — K7689 Other specified diseases of liver: Secondary | ICD-10-CM | POA: Diagnosis not present

## 2019-12-10 DIAGNOSIS — R1011 Right upper quadrant pain: Secondary | ICD-10-CM | POA: Diagnosis not present

## 2019-12-12 ENCOUNTER — Other Ambulatory Visit: Payer: Self-pay | Admitting: Internal Medicine

## 2019-12-12 DIAGNOSIS — K838 Other specified diseases of biliary tract: Secondary | ICD-10-CM

## 2019-12-12 DIAGNOSIS — R1011 Right upper quadrant pain: Secondary | ICD-10-CM

## 2019-12-13 NOTE — Progress Notes (Signed)
Needs appt with me to be scheduled after her MRCP has been done

## 2019-12-16 ENCOUNTER — Telehealth: Payer: Self-pay

## 2019-12-16 NOTE — Telephone Encounter (Signed)
LMTCB in regards to lab results.  

## 2019-12-25 ENCOUNTER — Telehealth: Payer: Self-pay

## 2019-12-25 DIAGNOSIS — K838 Other specified diseases of biliary tract: Secondary | ICD-10-CM

## 2019-12-25 DIAGNOSIS — R1011 Right upper quadrant pain: Secondary | ICD-10-CM

## 2019-12-25 NOTE — Telephone Encounter (Signed)
Per radiology and verbal okay from Dr. Derrel Nip MRI has been changed to w and w/o IV contrast.

## 2019-12-30 ENCOUNTER — Other Ambulatory Visit: Payer: Self-pay

## 2019-12-30 ENCOUNTER — Ambulatory Visit
Admission: RE | Admit: 2019-12-30 | Discharge: 2019-12-30 | Disposition: A | Discharge status: Home / Self Care | Payer: Medicare HMO | Source: Ambulatory Visit | Attending: Internal Medicine | Admitting: Internal Medicine

## 2019-12-30 DIAGNOSIS — R1011 Right upper quadrant pain: Secondary | ICD-10-CM | POA: Diagnosis not present

## 2019-12-30 DIAGNOSIS — K8050 Calculus of bile duct without cholangitis or cholecystitis without obstruction: Secondary | ICD-10-CM | POA: Diagnosis not present

## 2019-12-30 DIAGNOSIS — K838 Other specified diseases of biliary tract: Secondary | ICD-10-CM | POA: Insufficient documentation

## 2019-12-30 MED ORDER — GADOBUTROL 1 MMOL/ML IV SOLN
7.5000 mL | Freq: Once | INTRAVENOUS | Status: AC | PRN
  Administered 2019-12-30: 7.5 mL via INTRAVENOUS

## 2019-12-30 NOTE — Progress Notes (Signed)
Donna Richards news!  Your MRCP was normal.  Your liver was fine other than 2 benign cysts , and there was no evidence of a tumor or gallstone blocking the bile duct.

## 2020-01-02 ENCOUNTER — Other Ambulatory Visit: Payer: Self-pay

## 2020-01-02 ENCOUNTER — Encounter: Payer: Self-pay | Admitting: Internal Medicine

## 2020-01-02 ENCOUNTER — Telehealth (INDEPENDENT_AMBULATORY_CARE_PROVIDER_SITE_OTHER): Payer: Medicare HMO | Admitting: Internal Medicine

## 2020-01-02 DIAGNOSIS — D126 Benign neoplasm of colon, unspecified: Secondary | ICD-10-CM

## 2020-01-02 DIAGNOSIS — R1011 Right upper quadrant pain: Secondary | ICD-10-CM | POA: Diagnosis not present

## 2020-01-02 NOTE — Progress Notes (Signed)
Virtual Visit via [facility]  This visit type was conducted due to national recommendations for restrictions regarding the COVID-19 pandemic (e.g. social distancing).  This format is felt to be most appropriate for this patient at this time.  All issues noted in this document were discussed and addressed.  No physical exam was performed (except for noted visual exam findings with Video Visits).   I connected with@ on 01/02/20 at  8:00 AM EDT by a video enabled telemedicine application and verified that I am speaking with the correct person using two identifiers. Location patient: home Location provider: work or home office Persons participating in the virtual visit: patient, provider  I discussed the limitations, risks, security and privacy concerns of performing an evaluation and management service by telephone and the availability of in person appointments. I also discussed with the patient that there may be a patient responsible charge related to this service. The patient expressed understanding and agreed to proceed.   Reason for visit: follow up on MRCP  HPI:  69 yr old female recently evaluated for recurrent RUQ post prandial pain with limited RUQ ultrasound. No gallstones seen  But she was sent for further evaluation MRCP due to finding of distal CBD dilation, from a transition zone 3.3 to 6.6 mm  . MRCP showed no evidence of stricture or mass and no dilation of CBD /  .  Normal liver consistency,  2 benign cysts noted. Patient returns today for discussion of results.  Her RUQ pain is not occurring daily or even regularly an dis not accompanied by nausea or change in stools.  She prefers to defer any further workup at this time  She continues to take Protonix (chronic since 1999) for GERD without barrett's esophagus.    She takes vitamin D 5000 Ius daily for history of low D     ROS: See pertinent positives and negatives per HPI.  Past Medical History:  Diagnosis Date  . Allergy    . Arthritis   . Asthma   . Asthma, chronic 06/09/2014   Apparently diagnosed by Dr . Delilah Shan at [facility] many years ago with no pulmonology follow up sinc ethen.  Using flovent,  Which is his no longer covered by insurance.    Marland Kitchen GERD (gastroesophageal reflux disease)   . Hyperlipidemia   . Inflammatory polyps of colon ([place])   . Migraines   . Stroke ([place]) 1992  . UTI (urinary tract infection)     Past Surgical History:  Procedure Laterality Date  . ABDOMINAL HYSTERECTOMY  1996   endometriosis  . BILATERAL OOPHORECTOMY     endometriosis    Family History  Problem Relation Age of Onset  . Hyperlipidemia Mother   . Heart disease Mother   . Kidney disease Mother   . Diabetes Mother   . Heart disease Father   . COPD Father   . Diabetes Maternal Grandmother   . Heart disease Maternal Grandfather   . Stroke Paternal Grandmother     SOCIAL HX:  reports that she has never smoked. She has never used smokeless tobacco. She reports that she does not drink alcohol and does not use drugs.   Current Outpatient Medications:  .  albuterol (PROVENTIL HFA;VENTOLIN HFA) 108 (90 Base) MCG/ACT inhaler, Inhale into the lungs., Disp: , Rfl:  .  aspirin EC 81 MG tablet, Take 81 mg by mouth every other day., Disp: , Rfl:  .  cholecalciferol (VITAMIN D3) 25 MCG (1000 UNIT) tablet, Take 1,000 Units  by mouth daily., Disp: , Rfl:  .  fluticasone (FLONASE) 50 MCG/ACT nasal spray, instill 1 spray into each nostril twice a day, Disp: 16 g, Rfl: 5 .  hydrochlorothiazide (HYDRODIURIL) 25 MG tablet, TAKE 1 TABLET(25 MG) BY MOUTH DAILY, Disp: 90 tablet, Rfl: 1 .  pantoprazole (PROTONIX) 40 MG tablet, TAKE 1 TABLET(40 MG) BY MOUTH DAILY, Disp: 90 tablet, Rfl: 1 .  PULMICORT FLEXHALER 180 MCG/ACT inhaler, Inhale 180 mcg into the lungs as needed., Disp: , Rfl: 1 .  RA ALLERGY RELIEF 180 MG tablet, take 1 tablet by mouth once daily, Disp: 30 tablet, Rfl: 11 .  rosuvastatin (CRESTOR) 5 MG tablet, Take 1  tablet (5 mg total) by mouth daily., Disp: 90 tablet, Rfl: 3  EXAM:  VITALS per patient if applicable:  GENERAL: alert, oriented, appears well and in no acute distress  HEENT: atraumatic, conjunttiva clear, no obvious abnormalities on inspection of external nose and ears  NECK: normal movements of the head and neck  LUNGS: on inspection no signs of respiratory distress, breathing rate appears normal, no obvious gross SOB, gasping or wheezing  CV: no obvious cyanosis  MS: moves all visible extremities without noticeable abnormality  PSYCH/NEURO: pleasant and cooperative, no obvious depression or anxiety, speech and thought processing grossly intact  ASSESSMENT AND PLAN:  Discussed the following assessment and plan:  Adenomatous polyp of colon, unspecified part of colon  Colicky RUQ abdominal pain  Adenomatous colon polyp She will need a 5 yr follow up in 1478/   Colicky RUQ abdominal pain No gallstones or strictures by MRCP done after [place] suggested a mass.  Patient's symptoms are infrequent, and there has been no nnvoluntary weight loss of liver enzyme elevation.  GI referral deferred for now per patient preference.    I discussed the assessment and treatment plan with the patient. The patient was provided an opportunity to ask questions and all were answered. The patient agreed with the plan and demonstrated an understanding of the instructions.   The patient was advised to call back or seek an in-person evaluation if the symptoms worsen or if the condition fails to improve as anticipated.  I spent 30 minutes reviewing recent and prior imaging studies and discussing results of current studies with patient today during this encounter.   Crecencio Mc, MD

## 2020-01-02 NOTE — Assessment & Plan Note (Signed)
She will need a 5 yr follow up in 2024/

## 2020-01-02 NOTE — Assessment & Plan Note (Signed)
No gallstones or strictures by MRCP done after [place] suggested a mass.  Patient's symptoms are infrequent, and there has been no nnvoluntary weight loss of liver enzyme elevation.  GI referral deferred for now per patient preference.

## 2020-01-16 DIAGNOSIS — D2261 Melanocytic nevi of right upper limb, including shoulder: Secondary | ICD-10-CM | POA: Diagnosis not present

## 2020-01-16 DIAGNOSIS — D2272 Melanocytic nevi of left lower limb, including hip: Secondary | ICD-10-CM | POA: Diagnosis not present

## 2020-01-16 DIAGNOSIS — D2262 Melanocytic nevi of left upper limb, including shoulder: Secondary | ICD-10-CM | POA: Diagnosis not present

## 2020-01-16 DIAGNOSIS — D225 Melanocytic nevi of trunk: Secondary | ICD-10-CM | POA: Diagnosis not present

## 2020-01-16 DIAGNOSIS — Z85828 Personal history of other malignant neoplasm of skin: Secondary | ICD-10-CM | POA: Diagnosis not present

## 2020-01-16 DIAGNOSIS — L821 Other seborrheic keratosis: Secondary | ICD-10-CM | POA: Diagnosis not present

## 2020-01-16 DIAGNOSIS — C44619 Basal cell carcinoma of skin of left upper limb, including shoulder: Secondary | ICD-10-CM | POA: Diagnosis not present

## 2020-01-18 ENCOUNTER — Other Ambulatory Visit: Payer: Self-pay | Admitting: Internal Medicine

## 2020-01-26 ENCOUNTER — Encounter: Payer: Self-pay | Admitting: Internal Medicine

## 2020-01-26 ENCOUNTER — Telehealth (INDEPENDENT_AMBULATORY_CARE_PROVIDER_SITE_OTHER): Payer: Medicare HMO | Admitting: Internal Medicine

## 2020-01-26 ENCOUNTER — Other Ambulatory Visit: Payer: Self-pay | Admitting: Physician Assistant

## 2020-01-26 ENCOUNTER — Other Ambulatory Visit: Payer: Self-pay

## 2020-01-26 ENCOUNTER — Telehealth: Payer: Self-pay | Admitting: Physician Assistant

## 2020-01-26 DIAGNOSIS — I10 Essential (primary) hypertension: Secondary | ICD-10-CM

## 2020-01-26 DIAGNOSIS — R7303 Prediabetes: Secondary | ICD-10-CM

## 2020-01-26 DIAGNOSIS — E669 Obesity, unspecified: Secondary | ICD-10-CM

## 2020-01-26 DIAGNOSIS — U071 COVID-19: Secondary | ICD-10-CM

## 2020-01-26 DIAGNOSIS — N289 Disorder of kidney and ureter, unspecified: Secondary | ICD-10-CM

## 2020-01-26 NOTE — Telephone Encounter (Signed)
Called to discuss with patient about Covid symptoms and the use of sotrovimab, bamlanivimab/etesevimab or casirivimab/imdevimab, a monoclonal antibody infusion for those with mild to moderate Covid symptoms and at a high risk of hospitalization.  Pt is qualified for this infusion at the [facility] due to; Specific high risk criteria : Older age (>/= 68 yo), BMI > 25, Cardiovascular disease or hypertension and Chronic Lung Disease   Message left to call back our hotline [phone]. My chart message sent if active on Mychart. Also sent a text.  Angelena Form PA-C

## 2020-01-26 NOTE — Patient Instructions (Signed)
You seem to be doing very Well!!  Thank God!  For the cough:  Ok to combine guaifenesin and dextromethorphan (Mucinex and Tussin)  Monitor 02 sats and go to ER if sats drop to 90% or less  Continue tylenol /motrin as needed for body aches  On Day 7,  Stop the motrin /tylenol to monitor your fever curve  IF no fevers by Sunday,  You can break quarantine

## 2020-01-26 NOTE — Progress Notes (Signed)
Virtual Visit via caregility  This visit type was conducted due to national recommendations for restrictions regarding the COVID-19 pandemic (e.g. social distancing).  This format is felt to be most appropriate for this patient at this time.  All issues noted in this document were discussed and addressed.  No physical exam was performed (except for noted visual exam findings with Video Visits).   I connected with@ on 01/26/20 at  2:30 PM EST by a video enabled telemedicine applicationand verified that I am speaking with the correct person using two identifiers. Location patient: home Location provider: work or home office Persons participating in the virtual visit: patient, provider  I discussed the limitations, risks, security and privacy concerns of performing an evaluation and management service by telephone and the availability of in person appointments. I also discussed with the patient that there may be a patient responsible charge related to this service. The patient expressed understanding and agreed to proceed.  Reason for visit: POSITIVE COVID TEST  ON NOV 4  HPI:  69 YR OLD FEMALE WITH untreated OSA AND HYPERTENSION presents with COVID INFECTION . Symptoms o f fatigue started on Nov 2.  On Nov 4 developed flushing,  Body aches and fevers to 102.  Tested on Nov 4.  received results on Nov 5 at 5 pm.  Began taking mucinex for cough and drinking  pedialyte .  Appetite diminished,  But no anosmia or dysgeusia.  02 sats have not dropped below 97%   OSA: has not used CPAP in 8 years     ROS: See pertinent positives and negatives per HPI.  Past Medical History:  Diagnosis Date  . Allergy   . Arthritis   . Asthma   . Asthma, chronic 06/09/2014   Apparently diagnosed by Dr . Delilah Shan at [facility] many years ago with no pulmonology follow up sinc ethen.  Using flovent,  Which is his no longer covered by insurance.    Marland Kitchen GERD (gastroesophageal reflux disease)   . Hyperlipidemia   .  Inflammatory polyps of colon ([place])   . Migraines   . Stroke ([place]) 1992  . UTI (urinary tract infection)     Past Surgical History:  Procedure Laterality Date  . ABDOMINAL HYSTERECTOMY  1996   endometriosis  . BILATERAL OOPHORECTOMY     endometriosis    Family History  Problem Relation Age of Onset  . Hyperlipidemia Mother   . Heart disease Mother   . Kidney disease Mother   . Diabetes Mother   . Heart disease Father   . COPD Father   . Diabetes Maternal Grandmother   . Heart disease Maternal Grandfather   . Stroke Paternal Grandmother     SOCIAL HX:  reports that she has never smoked. She has never used smokeless tobacco. She reports that she does not drink alcohol and does not use drugs.  Current Outpatient Medications:  .  albuterol (PROVENTIL HFA;VENTOLIN HFA) 108 (90 Base) MCG/ACT inhaler, Inhale into the lungs., Disp: , Rfl:  .  aspirin EC 81 MG tablet, Take 81 mg by mouth every other day., Disp: , Rfl:  .  cholecalciferol (VITAMIN D3) 25 MCG (1000 UNIT) tablet, Take 1,000 Units by mouth daily., Disp: , Rfl:  .  fluticasone (FLONASE) 50 MCG/ACT nasal spray, instill 1 spray into each nostril twice a day, Disp: 16 g, Rfl: 5 .  hydrochlorothiazide (HYDRODIURIL) 25 MG tablet, TAKE 1 TABLET(25 MG) BY MOUTH DAILY, Disp: 90 tablet, Rfl: 1 .  pantoprazole (  PROTONIX) 40 MG tablet, TAKE 1 TABLET(40 MG) BY MOUTH DAILY, Disp: 90 tablet, Rfl: 1 .  PULMICORT FLEXHALER 180 MCG/ACT inhaler, Inhale 180 mcg into the lungs as needed., Disp: , Rfl: 1 .  RA ALLERGY RELIEF 180 MG tablet, take 1 tablet by mouth once daily, Disp: 30 tablet, Rfl: 11 .  rosuvastatin (CRESTOR) 5 MG tablet, Take 1 tablet (5 mg total) by mouth daily., Disp: 90 tablet, Rfl: 3 No current facility-administered medications for this visit.  Facility-Administered Medications Ordered in Other Visits:  .  0.9 %  sodium chloride infusion, , Intravenous, PRN, Eileen Stanford, PA-C .  albuterol (VENTOLIN HFA) 108 (90  Base) MCG/ACT inhaler 2 puff, 2 puff, Inhalation, Once PRN, Eileen Stanford, PA-C .  diphenhydrAMINE (BENADRYL) injection 50 mg, 50 mg, Intravenous, Once PRN, Eileen Stanford, PA-C .  EPINEPHrine (EPI-PEN) injection 0.3 mg, 0.3 mg, Intramuscular, Once PRN, Eileen Stanford, PA-C .  famotidine (PEPCID) IVPB 20 mg premix, 20 mg, Intravenous, Once PRN, Eileen Stanford, PA-C .  methylPREDNISolone sodium succinate (SOLU-MEDROL) 125 mg/2 mL injection 125 mg, 125 mg, Intravenous, Once PRN, Eileen Stanford, PA-C  EXAM:  VITALS per patient if applicable:  GENERAL: alert, oriented, appears well and in no acute distress  HEENT: atraumatic, conjunttiva clear, no obvious abnormalities on inspection of external nose and ears  NECK: normal movements of the head and neck  LUNGS: on inspection no signs of respiratory distress, breathing rate appears normal, no obvious gross SOB, gasping or wheezing  CV: no obvious cyanosis  MS: moves all visible extremities without noticeable abnormality  PSYCH/NEURO: pleasant and cooperative, no obvious depression or anxiety, speech and thought processing grossly intact  ASSESSMENT AND PLAN:  Discussed the following assessment and plan:  COVID-19 virus infection  COVID-19 virus infection Tested on Nov 4,  When symptoms became significant (fevers, body aches begain Nov 4).  Her course has been mild in spite of non vaccination status.  Referred to infusion center .  Conditions of quarantine end reviewed;  conditions for seeing immediate medical attention reviewed     I discussed the assessment and treatment plan with the patient. The patient was provided an opportunity to ask questions and all were answered. The patient agreed with the plan and demonstrated an understanding of the instructions.   The patient was advised to call back or seek an in-person evaluation if the symptoms worsen or if the condition fails to improve as anticipated.  I  provided 22 minutes of face-to-face time during this encounter.   Crecencio Mc, MD

## 2020-01-26 NOTE — Progress Notes (Cosign Needed)
I connected by phone with Leonor Liv on 01/26/2020 at 3:25 PM to discuss the potential use of a new treatment for mild to moderate COVID-19 viral infection in non-hospitalized patients.  This patient is a 69 y.o. female that meets the FDA criteria for Emergency Use Authorization of COVID monoclonal antibody sotrovimab, casirivimab/imdevimab or bamlamivimab/estevimab.  Has a (+) direct SARS-CoV-2 viral test result  Has mild or moderate COVID-19   Is NOT hospitalized due to COVID-19  Is within 10 days of symptom onset  Has at least one of the high risk factor(s) for progression to severe COVID-19 and/or hospitalization as defined in EUA.  Specific high risk criteria : Older age (>/= 68 yo), BMI > 25, Diabetes, Cardiovascular disease or hypertension and Chronic Lung Disease   I have spoken and communicated the following to the patient or parent/caregiver regarding COVID monoclonal antibody treatment:  1. FDA has authorized the emergency use for the treatment of mild to moderate COVID-19 in adults and pediatric patients with positive results of direct SARS-CoV-2 viral testing who are 27 years of age and older weighing at least 40 kg, and who are at high risk for progressing to severe COVID-19 and/or hospitalization.  2. The significant known and potential risks and benefits of COVID monoclonal antibody, and the extent to which such potential risks and benefits are unknown.  3. Information on available alternative treatments and the risks and benefits of those alternatives, including clinical trials.  4. Patients treated with COVID monoclonal antibody should continue to self-isolate and use infection control measures (e.g., wear mask, isolate, social distance, avoid sharing personal items, clean and disinfect "high touch" surfaces, and frequent handwashing) according to CDC guidelines.   5. The patient or parent/caregiver has the option to accept or refuse COVID monoclonal antibody  treatment.  After reviewing this information with the patient, the patient has agreed to receive one of the available covid 19 monoclonal antibodies and will be provided an appropriate fact sheet prior to infusion.  Sx onset 11/2. Set up for infusion on 11/9 @ 10:30am. Directions given to [facility]. Pt is aware that insurance will be charged an infusion fee. Pt is unvaccinated.   Angelena Form 01/26/2020 3:25 PM

## 2020-01-27 ENCOUNTER — Ambulatory Visit (HOSPITAL_COMMUNITY)
Admission: RE | Admit: 2020-01-27 | Discharge: 2020-01-27 | Disposition: A | Discharge status: Home / Self Care | Payer: Medicare HMO | Source: Ambulatory Visit | Attending: Pulmonary Disease | Admitting: Pulmonary Disease

## 2020-01-27 DIAGNOSIS — Z23 Encounter for immunization: Secondary | ICD-10-CM | POA: Insufficient documentation

## 2020-01-27 DIAGNOSIS — Z8616 Personal history of COVID-19: Secondary | ICD-10-CM | POA: Insufficient documentation

## 2020-01-27 DIAGNOSIS — U071 COVID-19: Secondary | ICD-10-CM | POA: Diagnosis present

## 2020-01-27 DIAGNOSIS — N289 Disorder of kidney and ureter, unspecified: Secondary | ICD-10-CM

## 2020-01-27 DIAGNOSIS — E669 Obesity, unspecified: Secondary | ICD-10-CM

## 2020-01-27 DIAGNOSIS — R7303 Prediabetes: Secondary | ICD-10-CM

## 2020-01-27 DIAGNOSIS — I10 Essential (primary) hypertension: Secondary | ICD-10-CM

## 2020-01-27 MED ORDER — SOTROVIMAB 500 MG/8ML IV SOLN
500.0000 mg | Freq: Once | INTRAVENOUS | Status: AC
  Administered 2020-01-27: 500 mg via INTRAVENOUS

## 2020-01-27 MED ORDER — FAMOTIDINE IN NACL 20-0.9 MG/50ML-% IV SOLN: 20.0000 mg | Freq: Once | INTRAVENOUS | Status: DC | PRN

## 2020-01-27 MED ORDER — ALBUTEROL SULFATE HFA 108 (90 BASE) MCG/ACT IN AERS: 2.0000 | INHALATION_SPRAY | Freq: Once | RESPIRATORY_TRACT | Status: DC | PRN

## 2020-01-27 MED ORDER — EPINEPHRINE 0.3 MG/0.3ML IJ SOAJ: 0.3000 mg | Freq: Once | INTRAMUSCULAR | Status: DC | PRN

## 2020-01-27 MED ORDER — DIPHENHYDRAMINE HCL 50 MG/ML IJ SOLN: 50.0000 mg | Freq: Once | INTRAMUSCULAR | Status: DC | PRN

## 2020-01-27 MED ORDER — SODIUM CHLORIDE 0.9 % IV SOLN: INTRAVENOUS | Status: DC | PRN

## 2020-01-27 MED ORDER — METHYLPREDNISOLONE SODIUM SUCC 125 MG IJ SOLR: 125.0000 mg | Freq: Once | INTRAMUSCULAR | Status: DC | PRN

## 2020-01-27 NOTE — Progress Notes (Signed)
  Diagnosis: COVID-19  Physician: Dr Joya Gaskins  Procedure: [facility] Med: bamlanivimab\etesevimab infusion - Provided patient with bamlanimivab\etesevimab fact sheet for patients, parents and caregivers prior to infusion.  Complications: No immediate complications noted.  Discharge: Discharged home   Ashley Murrain 01/27/2020

## 2020-01-27 NOTE — Discharge Instructions (Signed)
10 Things You Can Do to Manage Your COVID-19 Symptoms at Home If you have possible or confirmed COVID-19: 1. Stay home from work and school. And stay away from other public places. If you must go out, avoid using any kind of public transportation, ridesharing, or taxis. 2. Monitor your symptoms carefully. If your symptoms get worse, call your healthcare provider immediately. 3. Get rest and stay hydrated. 4. If you have a medical appointment, call the healthcare provider ahead of time and tell them that you have or may have COVID-19. 5. For medical emergencies, call 911 and notify the dispatch personnel that you have or may have COVID-19. 6. Cover your cough and sneezes with a tissue or use the inside of your elbow. 7. Wash your hands often with soap and water for at least 20 seconds or clean your hands with an alcohol-based hand sanitizer that contains at least 60% alcohol. 8. As much as possible, stay in a specific room and away from other people in your home. Also, you should use a separate bathroom, if available. If you need to be around other people in or outside of the home, wear a mask. 9. Avoid sharing personal items with other people in your household, like dishes, towels, and bedding. 10. Clean all surfaces that are touched often, like counters, tabletops, and doorknobs. Use household cleaning sprays or wipes according to the label instructions. cdc.gov/coronavirus 09/18/2018 This information is not intended to replace advice given to you by your health care provider. Make sure you discuss any questions you have with your health care provider. Document Revised: 02/20/2019 Document Reviewed: 02/20/2019 Elsevier Patient Education  2020 Elsevier Inc.   COVID-19 COVID-19 is a respiratory infection that is caused by a virus called severe acute respiratory syndrome coronavirus 2 (SARS-CoV-2). The disease is also known as coronavirus disease or novel coronavirus. In some people, the virus may  not cause any symptoms. In others, it may cause a serious infection. The infection can get worse quickly and can lead to complications, such as:  Pneumonia, or infection of the lungs.  Acute respiratory distress syndrome or ARDS. This is a condition in which fluid build-up in the lungs prevents the lungs from filling with air and passing oxygen into the blood.  Acute respiratory failure. This is a condition in which there is not enough oxygen passing from the lungs to the body or when carbon dioxide is not passing from the lungs out of the body.  Sepsis or septic shock. This is a serious bodily reaction to an infection.  Blood clotting problems.  Secondary infections due to bacteria or fungus.  Organ failure. This is when your body's organs stop working. The virus that causes COVID-19 is contagious. This means that it can spread from person to person through droplets from coughs and sneezes (respiratory secretions). What are the causes? This illness is caused by a virus. You may catch the virus by:  Breathing in droplets from an infected person. Droplets can be spread by a person breathing, speaking, singing, coughing, or sneezing.  Touching something, like a table or a doorknob, that was exposed to the virus (contaminated) and then touching your mouth, nose, or eyes. What increases the risk? Risk for infection You are more likely to be infected with this virus if you:  Are within 6 feet (2 meters) of a person with COVID-19.  Provide care for or live with a person who is infected with COVID-19.  Spend time in crowded indoor spaces or   live in shared housing. Risk for serious illness You are more likely to become seriously ill from the virus if you:  Are 50 years of age or older. The higher your age, the more you are at risk for serious illness.  Live in a nursing home or long-term care facility.  Have cancer.  Have a long-term (chronic) disease such as: ? Chronic lung disease,  including chronic obstructive pulmonary disease or asthma. ? A long-term disease that lowers your body's ability to fight infection (immunocompromised). ? Heart disease, including heart failure, a condition in which the arteries that lead to the heart become narrow or blocked (coronary artery disease), a disease which makes the heart muscle thick, weak, or stiff (cardiomyopathy). ? Diabetes. ? Chronic kidney disease. ? Sickle cell disease, a condition in which red blood cells have an abnormal "sickle" shape. ? Liver disease.  Are obese. What are the signs or symptoms? Symptoms of this condition can range from mild to severe. Symptoms may appear any time from 2 to 14 days after being exposed to the virus. They include:  A fever or chills.  A cough.  Difficulty breathing.  Headaches, body aches, or muscle aches.  Runny or stuffy (congested) nose.  A sore throat.  New loss of taste or smell. Some people may also have stomach problems, such as nausea, vomiting, or diarrhea. Other people may not have any symptoms of COVID-19. How is this diagnosed? This condition may be diagnosed based on:  Your signs and symptoms, especially if: ? You live in an area with a COVID-19 outbreak. ? You recently traveled to or from an area where the virus is common. ? You provide care for or live with a person who was diagnosed with COVID-19. ? You were exposed to a person who was diagnosed with COVID-19.  A physical exam.  Lab tests, which may include: ? Taking a sample of fluid from the back of your nose and throat (nasopharyngeal fluid), your nose, or your throat using a swab. ? A sample of mucus from your lungs (sputum). ? Blood tests.  Imaging tests, which may include, X-rays, CT scan, or ultrasound. How is this treated? At present, there is no medicine to treat COVID-19. Medicines that treat other diseases are being used on a trial basis to see if they are effective against COVID-19. Your  health care provider will talk with you about ways to treat your symptoms. For most people, the infection is mild and can be managed at home with rest, fluids, and over-the-counter medicines. Treatment for a serious infection usually takes places in a hospital intensive care unit (ICU). It may include one or more of the following treatments. These treatments are given until your symptoms improve.  Receiving fluids and medicines through an IV.  Supplemental oxygen. Extra oxygen is given through a tube in the nose, a face mask, or a hood.  Positioning you to lie on your stomach (prone position). This makes it easier for oxygen to get into the lungs.  Continuous positive airway pressure (CPAP) or bi-level positive airway pressure (BPAP) machine. This treatment uses mild air pressure to keep the airways open. A tube that is connected to a motor delivers oxygen to the body.  Ventilator. This treatment moves air into and out of the lungs by using a tube that is placed in your windpipe.  Tracheostomy. This is a procedure to create a hole in the neck so that a breathing tube can be inserted.  Extracorporeal membrane   oxygenation (ECMO). This procedure gives the lungs a chance to recover by taking over the functions of the heart and lungs. It supplies oxygen to the body and removes carbon dioxide. Follow these instructions at home: Lifestyle  If you are sick, stay home except to get medical care. Your health care provider will tell you how long to stay home. Call your health care provider before you go for medical care.  Rest at home as told by your health care provider.  Do not use any products that contain nicotine or tobacco, such as cigarettes, e-cigarettes, and chewing tobacco. If you need help quitting, ask your health care provider.  Return to your normal activities as told by your health care provider. Ask your health care provider what activities are safe for you. General  instructions  Take over-the-counter and prescription medicines only as told by your health care provider.  Drink enough fluid to keep your urine pale yellow.  Keep all follow-up visits as told by your health care provider. This is important. How is this prevented?  There is no vaccine to help prevent COVID-19 infection. However, there are steps you can take to protect yourself and others from this virus. To protect yourself:   Do not travel to areas where COVID-19 is a risk. The areas where COVID-19 is reported change often. To identify high-risk areas and travel restrictions, check the CDC travel website: wwwnc.cdc.gov/travel/notices  If you live in, or must travel to, an area where COVID-19 is a risk, take precautions to avoid infection. ? Stay away from people who are sick. ? Wash your hands often with soap and water for 20 seconds. If soap and water are not available, use an alcohol-based hand sanitizer. ? Avoid touching your mouth, face, eyes, or nose. ? Avoid going out in public, follow guidance from your state and local health authorities. ? If you must go out in public, wear a cloth face covering or face mask. Make sure your mask covers your nose and mouth. ? Avoid crowded indoor spaces. Stay at least 6 feet (2 meters) away from others. ? Disinfect objects and surfaces that are frequently touched every day. This may include:  Counters and tables.  Doorknobs and light switches.  Sinks and faucets.  Electronics, such as phones, remote controls, keyboards, computers, and tablets. To protect others: If you have symptoms of COVID-19, take steps to prevent the virus from spreading to others.  If you think you have a COVID-19 infection, contact your health care provider right away. Tell your health care team that you think you may have a COVID-19 infection.  Stay home. Leave your house only to seek medical care. Do not use public transport.  Do not travel while you are  sick.  Wash your hands often with soap and water for 20 seconds. If soap and water are not available, use alcohol-based hand sanitizer.  Stay away from other members of your household. Let healthy household members care for children and pets, if possible. If you have to care for children or pets, wash your hands often and wear a mask. If possible, stay in your own room, separate from others. Use a different bathroom.  Make sure that all people in your household wash their hands well and often.  Cough or sneeze into a tissue or your sleeve or elbow. Do not cough or sneeze into your hand or into the air.  Wear a cloth face covering or face mask. Make sure your mask covers your nose   and mouth. Where to find more information  Centers for Disease Control and Prevention: www.cdc.gov/coronavirus/2019-ncov/index.html  World Health Organization: www.who.int/health-topics/coronavirus Contact a health care provider if:  You live in or have traveled to an area where COVID-19 is a risk and you have symptoms of the infection.  You have had contact with someone who has COVID-19 and you have symptoms of the infection. Get help right away if:  You have trouble breathing.  You have pain or pressure in your chest.  You have confusion.  You have bluish lips and fingernails.  You have difficulty waking from sleep.  You have symptoms that get worse. These symptoms may represent a serious problem that is an emergency. Do not wait to see if the symptoms will go away. Get medical help right away. Call your local emergency services (911 in the [place] Do not drive yourself to the hospital. Let the emergency medical personnel know if you think you have COVID-19. Summary  COVID-19 is a respiratory infection that is caused by a virus. It is also known as coronavirus disease or novel coronavirus. It can cause serious infections, such as pneumonia, acute respiratory distress syndrome, acute respiratory failure,  or sepsis.  The virus that causes COVID-19 is contagious. This means that it can spread from person to person through droplets from breathing, speaking, singing, coughing, or sneezing.  You are more likely to develop a serious illness if you are 50 years of age or older, have a weak immune system, live in a nursing home, or have chronic disease.  There is no medicine to treat COVID-19. Your health care provider will talk with you about ways to treat your symptoms.  Take steps to protect yourself and others from infection. Wash your hands often and disinfect objects and surfaces that are frequently touched every day. Stay away from people who are sick and wear a mask if you are sick. This information is not intended to replace advice given to you by your health care provider. Make sure you discuss any questions you have with your health care provider. Document Revised: 01/03/2019 Document Reviewed: 04/11/2018 Elsevier Patient Education  2020 Elsevier Inc.  What types of side effects do monoclonal antibody drugs cause?  Common side effects  In general, the more common side effects caused by monoclonal antibody drugs include: . Allergic reactions, such as hives or itching . Flu-like signs and symptoms, including chills, fatigue, fever, and muscle aches and pains . Nausea, vomiting . Diarrhea . Skin rashes . Low blood pressure   The CDC is recommending patients who receive monoclonal antibody treatments wait at least 90 days before being vaccinated.  Currently, there are no data on the safety and efficacy of mRNA COVID-19 vaccines in persons who received monoclonal antibodies or convalescent plasma as part of COVID-19 treatment. Based on the estimated half-life of such therapies as well as evidence suggesting that reinfection is uncommon in the 90 days after initial infection, vaccination should be deferred for at least 90 days, as a precautionary measure until additional information becomes  available, to avoid interference of the antibody treatment with vaccine-induced immune responses.  

## 2020-01-27 NOTE — Assessment & Plan Note (Signed)
Tested on Nov 4,  When symptoms became significant (fevers, body aches begain Nov 4).  Her course has been mild in spite of non vaccination status.  Referred to infusion center .  Conditions of quarantine end reviewed;  conditions for seeing immediate medical attention reviewed

## 2020-02-03 DIAGNOSIS — C44619 Basal cell carcinoma of skin of left upper limb, including shoulder: Secondary | ICD-10-CM | POA: Diagnosis not present

## 2020-03-23 DIAGNOSIS — Z1231 Encounter for screening mammogram for malignant neoplasm of breast: Secondary | ICD-10-CM | POA: Diagnosis not present

## 2020-03-23 LAB — HM MAMMOGRAPHY

## 2020-04-17 ENCOUNTER — Other Ambulatory Visit: Payer: Self-pay | Admitting: Internal Medicine

## 2020-04-29 ENCOUNTER — Other Ambulatory Visit: Payer: Self-pay

## 2020-04-29 MED ORDER — ROSUVASTATIN CALCIUM 5 MG PO TABS
5.0000 mg | ORAL_TABLET | Freq: Every day | ORAL | 1 refills | Status: DC
Start: 2020-04-29 — End: 2020-07-28

## 2020-04-29 MED ORDER — HYDROCHLOROTHIAZIDE 25 MG PO TABS
ORAL_TABLET | ORAL | 1 refills | Status: DC
Start: 2020-04-29 — End: 2020-07-28

## 2020-07-06 DIAGNOSIS — D2262 Melanocytic nevi of left upper limb, including shoulder: Secondary | ICD-10-CM | POA: Diagnosis not present

## 2020-07-06 DIAGNOSIS — D2261 Melanocytic nevi of right upper limb, including shoulder: Secondary | ICD-10-CM | POA: Diagnosis not present

## 2020-07-06 DIAGNOSIS — Z85828 Personal history of other malignant neoplasm of skin: Secondary | ICD-10-CM | POA: Diagnosis not present

## 2020-07-06 DIAGNOSIS — L821 Other seborrheic keratosis: Secondary | ICD-10-CM | POA: Diagnosis not present

## 2020-07-06 DIAGNOSIS — D2272 Melanocytic nevi of left lower limb, including hip: Secondary | ICD-10-CM | POA: Diagnosis not present

## 2020-07-06 DIAGNOSIS — D2271 Melanocytic nevi of right lower limb, including hip: Secondary | ICD-10-CM | POA: Diagnosis not present

## 2020-07-19 ENCOUNTER — Other Ambulatory Visit: Payer: Self-pay | Admitting: Internal Medicine

## 2020-07-28 ENCOUNTER — Other Ambulatory Visit: Payer: Self-pay | Admitting: Internal Medicine

## 2020-08-05 ENCOUNTER — Ambulatory Visit (INDEPENDENT_AMBULATORY_CARE_PROVIDER_SITE_OTHER): Payer: Medicare HMO

## 2020-08-05 ENCOUNTER — Other Ambulatory Visit: Payer: Self-pay

## 2020-08-05 VITALS — BP 97/66 | HR 80 | Temp 97.1°F | Resp 14 | Ht 65.0 in | Wt 204.0 lb

## 2020-08-05 DIAGNOSIS — Z0000 Encounter for general adult medical examination without abnormal findings: Secondary | ICD-10-CM

## 2020-08-05 NOTE — Progress Notes (Signed)
Subjective:   Donna Richards is a 70 y.o. female who presents for an Initial Medicare Annual Wellness Visit.  Review of Systems    No ROS.  Medicare Wellness  Cardiac Risk Factors include: advanced age (>29men, >31 women)     Objective:    Today's Vitals   08/05/20 1453  BP: 97/66  Pulse: 80  Resp: 14  Temp: (!) 97.1 F (36.2 C)  SpO2: 97%  Weight: 204 lb (92.5 kg)  Height: 5\' 5"  (1.651 m)   Body mass index is 33.95 kg/m.  Advanced Directives 08/05/2020 08/13/2019  Does Patient Have a Medical Advance Directive? No No  Would patient like information on creating a medical advance directive? No - Patient declined Yes (MAU/Ambulatory/Procedural Areas - Information given)    Current Medications (verified) Outpatient Encounter Medications as of 08/05/2020  Medication Sig  . albuterol (PROVENTIL HFA;VENTOLIN HFA) 108 (90 Base) MCG/ACT inhaler Inhale into the lungs.  Marland Kitchen aspirin EC 81 MG tablet Take 81 mg by mouth every other day.  . cholecalciferol (VITAMIN D3) 25 MCG (1000 UNIT) tablet Take 1,000 Units by mouth daily.  . fluticasone (FLONASE) 50 MCG/ACT nasal spray instill 1 spray into each nostril twice a day  . hydrochlorothiazide (HYDRODIURIL) 25 MG tablet TAKE 1 TABLET(25 MG) BY MOUTH DAILY  . pantoprazole (PROTONIX) 40 MG tablet TAKE 1 TABLET(40 MG) BY MOUTH DAILY  . PULMICORT FLEXHALER 180 MCG/ACT inhaler Inhale 180 mcg into the lungs as needed.  Marland Kitchen RA ALLERGY RELIEF 180 MG tablet take 1 tablet by mouth once daily  . rosuvastatin (CRESTOR) 5 MG tablet TAKE 1 TABLET(5 MG) BY MOUTH DAILY   No facility-administered encounter medications on file as of 08/05/2020.    Allergies (verified) Eggs or egg-derived products   History: Past Medical History:  Diagnosis Date  . Allergy   . Arthritis   . Asthma   . Asthma, chronic 06/09/2014   Apparently diagnosed by Dr . Delilah Shan at [facility] many years ago with no pulmonology follow up sinc ethen.  Using flovent,  Which is  his no longer covered by insurance.    Marland Kitchen GERD (gastroesophageal reflux disease)   . Hyperlipidemia   . Inflammatory polyps of colon ([place])   . Migraines   . Stroke ([place]) 1992  . UTI (urinary tract infection)    Past Surgical History:  Procedure Laterality Date  . ABDOMINAL HYSTERECTOMY  1996   endometriosis  . BILATERAL OOPHORECTOMY     endometriosis   Family History  Problem Relation Age of Onset  . Hyperlipidemia Mother   . Heart disease Mother   . Kidney disease Mother   . Diabetes Mother   . Heart disease Father   . COPD Father   . Diabetes Maternal Grandmother   . Heart disease Maternal Grandfather   . Stroke Paternal Grandmother    Social History   Socioeconomic History  . Marital status: Divorced    Spouse name: Not on file  . Number of children: Not on file  . Years of education: Not on file  . Highest education level: Not on file  Occupational History  . Not on file  Tobacco Use  . Smoking status: Never Smoker  . Smokeless tobacco: Never Used  Substance and Sexual Activity  . Alcohol use: No  . Drug use: No  . Sexual activity: Yes  Other Topics Concern  . Not on file  Social History Narrative  . Not on file   Social Determinants of Health  Financial Resource Strain: Low Risk   . Difficulty of Paying Living Expenses: Not hard at all  Food Insecurity: No Food Insecurity  . Worried About Charity fundraiser in the Last Year: Never true  . Ran Out of Food in the Last Year: Never true  Transportation Needs: No Transportation Needs  . Lack of Transportation (Medical): No  . Lack of Transportation (Non-Medical): No  Physical Activity: Inactive  . Days of Exercise per Week: 0 days  . Minutes of Exercise per Session: 0 min  Stress: No Stress Concern Present  . Feeling of Stress : Not at all  Social Connections: Unknown  . Frequency of Communication with Friends and Family: More than three times a week  . Frequency of Social Gatherings with Friends and  Family: More than three times a week  . Attends Religious Services: Not on file  . Active Member of Clubs or Organizations: Not on file  . Attends Archivist Meetings: Not on file  . Marital Status: Not on file    Tobacco Counseling Counseling given: Not Answered   Clinical Intake:  Pre-visit preparation completed: Yes        Diabetes: No  How often do you need to have someone help you when you read instructions, pamphlets, or other written materials from your doctor or pharmacy?: 1 - Never    Interpreter Needed?: No      Activities of Daily Living In your present state of health, do you have any difficulty performing the following activities: 08/05/2020 08/13/2019  Hearing? N N  Vision? N N  Difficulty concentrating or making decisions? N N  Walking or climbing stairs? N N  Dressing or bathing? N N  Doing errands, shopping? N N  Preparing Food and eating ? N N  Using the Toilet? N N  In the past six months, have you accidently leaked urine? N N  Do you have problems with loss of bowel control? N N  Managing your Medications? N N  Managing your Finances? N N  Housekeeping or managing your Housekeeping? N N  Some recent data might be hidden    Patient Care Team: Crecencio Mc, MD as PCP - General (Internal Medicine)  Indicate any recent Medical Services you may have received from other than [facility] providers in the past year (date may be approximate).     Assessment:   This is a routine wellness examination for Donna Richards.  Hearing/Vision screen  Hearing Screening   125Hz  250Hz  500Hz  1000Hz  2000Hz  3000Hz  4000Hz  6000Hz  8000Hz   Right ear:           Left ear:           Comments: Patient is able to hear conversational tones without difficulty.  No issues reported.  Vision Screening Comments: Visual acuity not assessed, virtual visit.  They have seen their ophthalmologist in the last 12 months.    Dietary issues and exercise activities  discussed: Current Exercise Habits: The patient does not participate in regular exercise at present   Regular diet Fair water intake  Goals Addressed              This Visit's Progress     Patient Stated   .  Weight goal 175lb (pt-stated)        Walk more for exercise Portion control meals Stay hydrated      Depression Screen PHQ 2/9 Scores 08/05/2020 08/13/2019 02/22/2018 08/23/2016  PHQ - 2 Score 0 0 0 0  PHQ- 9 Score - - 1 1    Fall Risk Fall Risk  08/05/2020 01/26/2020 01/02/2020 12/02/2019 08/13/2019  Falls in the past year? 0 0 0 0 0  Number falls in past yr: 0 - - - -  Injury with Fall? 0 - - - -  Follow up Falls evaluation completed Falls evaluation completed Falls evaluation completed Falls evaluation completed -    FALL RISK PREVENTION PERTAINING TO THE HOME: Handrails in use when climbing? Yes Home free of loose throw rugs in walkways, pet beds, electrical cords, etc? Yes  Adequate lighting in your home to reduce risk of falls? Yes   ASSISTIVE DEVICES UTILIZED TO PREVENT FALLS: Life alert? No  Use of a cane, walker or w/c? No   TIMED UP AND GO: Was the test performed? Yes .   Cognitive Function:  Patient is alert and oriented x3.  Denies difficulty focusing, making decisions, memory loss.  Works in [employer]. MMSE/6CIT deferred. Normal by direct communication/observation.       Immunizations Immunization History  Administered Date(s) Administered  . Influenza Inj Mdck Quad Pf 01/13/2019  . Influenza, Quadrivalent, Recombinant, Inj, Pf 01/02/2017, 01/25/2018  . Pneumococcal Conjugate-13 08/23/2016  . Tdap 07/26/2012  . Zoster 07/28/2013    Health Maintenance Health Maintenance  Topic Date Due  . PNA vac Low Risk Adult (2 of 2 - PPSV23) 08/23/2017  . INFLUENZA VACCINE  10/18/2020  . MAMMOGRAM  03/23/2021  . COLONOSCOPY (Pts 45-44yrs Insurance coverage will need to be confirmed)  05/26/2022  . TETANUS/TDAP  07/27/2022  . DEXA SCAN   Completed  . Hepatitis C Screening  Completed  . HPV VACCINES  Aged Out  . COVID-19 Vaccine  Discontinued   Colorectal cancer screening: Type of screening: Colonoscopy. Completed 05/25/17. Repeat every 5 years  Mammogram status: Completed 03/23/20. Repeat every year  Bone density- deferred per patient.   Lung Cancer Screening: (Low Dose CT Chest recommended if Age 30-80 years, 30 pack-year currently smoking OR have quit w/in 15years.) does not qualify.   Vision Screening: Recommended annual ophthalmology exams for early detection of glaucoma and other disorders of the eye. Is the patient up to date with their annual eye exam?  Yes   Dental Screening: Recommended annual dental exams for proper oral hygiene.  Community Resource Referral / Chronic Care Management: CRR required this visit?  No   CCM required this visit?  No      Plan:   Keep all routine maintenance appointments.   I have personally reviewed and noted the following in the patient's chart:   . Medical and social history . Use of alcohol, tobacco or illicit drugs  . Current medications and supplements including opioid prescriptions. Patient is not currently taking opioid prescriptions. . Functional ability and status . Nutritional status . Physical activity . Advanced directives . List of other physicians . Hospitalizations, surgeries, and ER visits in previous 12 months . Vitals . Screenings to include cognitive, depression, and falls . Referrals and appointments  In addition, I have reviewed and discussed with patient certain preventive protocols, quality metrics, and best practice recommendations. A written personalized care plan for preventive services as well as general preventive health recommendations were provided to patient.     Varney Biles, LPN   [id number]

## 2020-08-05 NOTE — Patient Instructions (Addendum)
Donna Richards , Thank you for taking time to come for your Medicare Wellness Visit. I appreciate your ongoing commitment to your health goals. Please review the following plan we discussed and let me know if I can assist you in the future.   These are the goals we discussed: Goals      Patient Stated   .  Weight goal 175lb (pt-stated)      Walk more for exercise Portion control meals Stay hydrated       This is a list of the screening recommended for you and due dates:  Health Maintenance  Topic Date Due  . Pneumonia vaccines (2 of 2 - PPSV23) 08/23/2017  . Flu Shot  10/18/2020  . Mammogram  03/23/2021  . Colon Cancer Screening  05/26/2022  . Tetanus Vaccine  07/27/2022  . DEXA scan (bone density measurement)  Completed  . Hepatitis C Screening: USPSTF Recommendation to screen - Ages 16-79 yo.  Completed  . HPV Vaccine  Aged Out  . COVID-19 Vaccine  Discontinued    Immunizations Immunization History  Administered Date(s) Administered  . Influenza Inj Mdck Quad Pf 01/13/2019  . Influenza, Quadrivalent, Recombinant, Inj, Pf 01/02/2017, 01/25/2018  . Pneumococcal Conjugate-13 08/23/2016  . Tdap 07/26/2012  . Zoster 07/28/2013   Advanced directives: not yet completed  Conditions/risks identified: none new  Follow up in one year for your annual wellness visit    Preventive Care 65 Years and Older, Female Preventive care refers to lifestyle choices and visits with your health care provider that can promote health and wellness. What does preventive care include?  A yearly physical exam. This is also called an annual well check.  Dental exams once or twice a year.  Routine eye exams. Ask your health care provider how often you should have your eyes checked.  Personal lifestyle choices, including:  Daily care of your teeth and gums.  Regular physical activity.  Eating a healthy diet.  Avoiding tobacco and drug use.  Limiting alcohol use.  Practicing safe  sex.  Taking low-dose aspirin every day.  Taking vitamin and mineral supplements as recommended by your health care provider. What happens during an annual well check? The services and screenings done by your health care provider during your annual well check will depend on your age, overall health, lifestyle risk factors, and family history of disease. Counseling  Your health care provider may ask you questions about your:  Alcohol use.  Tobacco use.  Drug use.  Emotional well-being.  Home and relationship well-being.  Sexual activity.  Eating habits.  History of falls.  Memory and ability to understand (cognition).  Work and work Statistician.  Reproductive health. Screening  You may have the following tests or measurements:  Height, weight, and BMI.  Blood pressure.  Lipid and cholesterol levels. These may be checked every 5 years, or more frequently if you are over 5 years old.  Skin check.  Lung cancer screening. You may have this screening every year starting at age 67 if you have a 30-pack-year history of smoking and currently smoke or have quit within the past 15 years.  Fecal occult blood test (FOBT) of the stool. You may have this test every year starting at age 75.  Flexible sigmoidoscopy or colonoscopy. You may have a sigmoidoscopy every 5 years or a colonoscopy every 10 years starting at age 53.  Hepatitis C blood test.  Hepatitis B blood test.  Sexually transmitted disease (STD) testing.  Diabetes screening. This  is done by checking your blood sugar (glucose) after you have not eaten for a while (fasting). You may have this done every 1-3 years.  Bone density scan. This is done to screen for osteoporosis. You may have this done starting at age 80.  Mammogram. This may be done every 1-2 years. Talk to your health care provider about how often you should have regular mammograms. Talk with your health care provider about your test results,  treatment options, and if necessary, the need for more tests. Vaccines  Your health care provider may recommend certain vaccines, such as:  Influenza vaccine. This is recommended every year.  Tetanus, diphtheria, and acellular pertussis (Tdap, Td) vaccine. You may need a Td booster every 10 years.  Zoster vaccine. You may need this after age 43.  Pneumococcal 13-valent conjugate (PCV13) vaccine. One dose is recommended after age 69.  Pneumococcal polysaccharide (PPSV23) vaccine. One dose is recommended after age 21. Talk to your health care provider about which screenings and vaccines you need and how often you need them. This information is not intended to replace advice given to you by your health care provider. Make sure you discuss any questions you have with your health care provider. Document Released: 04/02/2015 Document Revised: 11/24/2015 Document Reviewed: 01/05/2015 Elsevier Interactive Patient Education  2017 [place] Prevention in the Home Falls can cause injuries. They can happen to people of all ages. There are many things you can do to make your home safe and to help prevent falls. What can I do on the outside of my home?  Regularly fix the edges of walkways and driveways and fix any cracks.  Remove anything that might make you trip as you walk through a door, such as a raised step or threshold.  Trim any bushes or trees on the path to your home.  Use bright outdoor lighting.  Clear any walking paths of anything that might make someone trip, such as rocks or tools.  Regularly check to see if handrails are loose or broken. Make sure that both sides of any steps have handrails.  Any raised decks and porches should have guardrails on the edges.  Have any leaves, snow, or ice cleared regularly.  Use sand or salt on walking paths during winter.  Clean up any spills in your garage right away. This includes oil or grease spills. What can I do in the  bathroom?  Use night lights.  Install grab bars by the toilet and in the tub and shower. Do not use towel bars as grab bars.  Use non-skid mats or decals in the tub or shower.  If you need to sit down in the shower, use a plastic, non-slip stool.  Keep the floor dry. Clean up any water that spills on the floor as soon as it happens.  Remove soap buildup in the tub or shower regularly.  Attach bath mats securely with double-sided non-slip rug tape.  Do not have throw rugs and other things on the floor that can make you trip. What can I do in the bedroom?  Use night lights.  Make sure that you have a light by your bed that is easy to reach.  Do not use any sheets or blankets that are too big for your bed. They should not hang down onto the floor.  Have a firm chair that has side arms. You can use this for support while you get dressed.  Do not have throw rugs and other  things on the floor that can make you trip. What can I do in the kitchen?  Clean up any spills right away.  Avoid walking on wet floors.  Keep items that you use a lot in easy-to-reach places.  If you need to reach something above you, use a strong step stool that has a grab bar.  Keep electrical cords out of the way.  Do not use floor polish or wax that makes floors slippery. If you must use wax, use non-skid floor wax.  Do not have throw rugs and other things on the floor that can make you trip. What can I do with my stairs?  Do not leave any items on the stairs.  Make sure that there are handrails on both sides of the stairs and use them. Fix handrails that are broken or loose. Make sure that handrails are as long as the stairways.  Check any carpeting to make sure that it is firmly attached to the stairs. Fix any carpet that is loose or worn.  Avoid having throw rugs at the top or bottom of the stairs. If you do have throw rugs, attach them to the floor with carpet tape.  Make sure that you have a  light switch at the top of the stairs and the bottom of the stairs. If you do not have them, ask someone to add them for you. What else can I do to help prevent falls?  Wear shoes that:  Do not have high heels.  Have rubber bottoms.  Are comfortable and fit you well.  Are closed at the toe. Do not wear sandals.  If you use a stepladder:  Make sure that it is fully opened. Do not climb a closed stepladder.  Make sure that both sides of the stepladder are locked into place.  Ask someone to hold it for you, if possible.  Clearly mark and make sure that you can see:  Any grab bars or handrails.  First and last steps.  Where the edge of each step is.  Use tools that help you move around (mobility aids) if they are needed. These include:  Canes.  Walkers.  Scooters.  Crutches.  Turn on the lights when you go into a dark area. Replace any light bulbs as soon as they burn out.  Set up your furniture so you have a clear path. Avoid moving your furniture around.  If any of your floors are uneven, fix them.  If there are any pets around you, be aware of where they are.  Review your medicines with your doctor. Some medicines can make you feel dizzy. This can increase your chance of falling. Ask your doctor what other things that you can do to help prevent falls. This information is not intended to replace advice given to you by your health care provider. Make sure you discuss any questions you have with your health care provider. Document Released: 12/31/2008 Document Revised: 08/12/2015 Document Reviewed: 04/10/2014 Elsevier Interactive Patient Education  2017 [employer]vier Inc[employer]`

## 2020-09-14 DIAGNOSIS — R0600 Dyspnea, unspecified: Secondary | ICD-10-CM | POA: Diagnosis not present

## 2020-09-14 DIAGNOSIS — G4733 Obstructive sleep apnea (adult) (pediatric): Secondary | ICD-10-CM | POA: Diagnosis not present

## 2020-09-14 DIAGNOSIS — J4520 Mild intermittent asthma, uncomplicated: Secondary | ICD-10-CM | POA: Diagnosis not present

## 2020-12-03 ENCOUNTER — Encounter: Payer: Self-pay | Admitting: Internal Medicine

## 2020-12-03 ENCOUNTER — Other Ambulatory Visit: Payer: Self-pay

## 2020-12-03 ENCOUNTER — Ambulatory Visit (INDEPENDENT_AMBULATORY_CARE_PROVIDER_SITE_OTHER): Payer: Medicare HMO | Admitting: Internal Medicine

## 2020-12-03 VITALS — BP 112/64 | HR 77 | Temp 96.6°F | Ht 65.0 in | Wt 200.0 lb

## 2020-12-03 DIAGNOSIS — R1011 Right upper quadrant pain: Secondary | ICD-10-CM | POA: Diagnosis not present

## 2020-12-03 DIAGNOSIS — Z0000 Encounter for general adult medical examination without abnormal findings: Secondary | ICD-10-CM | POA: Diagnosis not present

## 2020-12-03 DIAGNOSIS — D126 Benign neoplasm of colon, unspecified: Secondary | ICD-10-CM

## 2020-12-03 DIAGNOSIS — N289 Disorder of kidney and ureter, unspecified: Secondary | ICD-10-CM | POA: Diagnosis not present

## 2020-12-03 DIAGNOSIS — E669 Obesity, unspecified: Secondary | ICD-10-CM

## 2020-12-03 DIAGNOSIS — Z1231 Encounter for screening mammogram for malignant neoplasm of breast: Secondary | ICD-10-CM | POA: Diagnosis not present

## 2020-12-03 DIAGNOSIS — I10 Essential (primary) hypertension: Secondary | ICD-10-CM | POA: Diagnosis not present

## 2020-12-03 DIAGNOSIS — E785 Hyperlipidemia, unspecified: Secondary | ICD-10-CM | POA: Diagnosis not present

## 2020-12-03 DIAGNOSIS — R7303 Prediabetes: Secondary | ICD-10-CM

## 2020-12-03 LAB — COMPREHENSIVE METABOLIC PANEL
ALT: 18 U/L (ref 0–35)
AST: 18 U/L (ref 0–37)
Albumin: 4.1 g/dL (ref 3.5–5.2)
Alkaline Phosphatase: 33 U/L — ABNORMAL LOW (ref 39–117)
BUN: 20 mg/dL (ref 6–23)
CO2: 32 mEq/L (ref 19–32)
Calcium: 9.8 mg/dL (ref 8.4–10.5)
Chloride: 100 mEq/L (ref 96–112)
Creatinine, Ser: 1.05 mg/dL (ref 0.40–1.20)
GFR: 54.29 mL/min — ABNORMAL LOW (ref >60.00)
Glucose, Bld: 88 mg/dL (ref 70–99)
Potassium: 3.9 mEq/L (ref 3.5–5.1)
Sodium: 140 mEq/L (ref 135–145)
Total Bilirubin: 1 mg/dL (ref 0.2–1.2)
Total Protein: 6.5 g/dL (ref 6.0–8.3)

## 2020-12-03 LAB — LIPID PANEL
Cholesterol: 155 mg/dL (ref 0–200)
HDL: 71.9 mg/dL (ref >39.00)
LDL Cholesterol: 71 mg/dL (ref 0–99)
NonHDL: 82.95
Total CHOL/HDL Ratio: 2
Triglycerides: 60 mg/dL (ref 0.0–149.0)
VLDL: 12 mg/dL (ref 0.0–40.0)

## 2020-12-03 LAB — HEMOGLOBIN A1C: Hgb A1c MFr Bld: 5.9 % (ref 4.6–6.5)

## 2020-12-03 LAB — TSH: TSH: 1.5 u[IU]/mL (ref 0.35–5.50)

## 2020-12-03 NOTE — Assessment & Plan Note (Addendum)
With prediabetes by last a1c. Participation Im regular exercise  and dherenece to low GI diet recommended .  She is disinclined to exercise regularly due to bilateral  knee pain

## 2020-12-03 NOTE — Progress Notes (Signed)
Patient ID: Donna Richards, female    DOB: [birth year]  Age: 70 y.o. MRN: [id number]  The patient is here for annual preventive examination and management of other chronic and acute problems.  This visit occurred during the SARS-CoV-2 public health emergency.  Safety protocols were in place, including screening questions prior to the visit, additional usage of staff PPE, and extensive cleaning of exam room while observing appropriate contact time as indicated for disinfecting solutions.     The risk factors are reflected in the social history.  The roster of all physicians providing medical care to patient - is listed in the Snapshot section of the chart.  Activities of daily living:  The patient is 100% independent in all ADLs: dressing, toileting, feeding as well as independent mobility  Home safety : The patient has smoke detectors in the home. They wear seatbelts.  There are no firearms at home. There is no violence in the home.   There is no risks for hepatitis, STDs or HIV. There is no   history of blood transfusion. They have no travel history to infectious disease endemic areas of the world.  The patient has seen their dentist in the last six month. They have seen their eye doctor in the last year. They admit to slight hearing difficulty with regard to whispered voices and some television programs.  They have deferred audiologic testing in the last year.  They do not  have excessive sun exposure. Discussed the need for sun protection: hats, long sleeves and use of sunscreen if there is significant sun exposure.   Diet: the importance of a healthy diet is discussed. They do have a healthy diet.  The benefits of regular aerobic exercise were discussed. She walks 4 times per week ,  20 minutes.   Depression screen: there are no signs or vegative symptoms of depression- irritability, change in appetite, anhedonia, sadness/tearfullness.  Cognitive assessment: the patient manages all their  financial and personal affairs and is actively engaged. They could relate day,date,year and events; recalled 2/3 objects at 3 minutes; performed clock-face test normally.  The following portions of the patient's history were reviewed and updated as appropriate: allergies, current medications, past family history, past medical history,  past surgical history, past social history  and problem list.  Visual acuity was not assessed per patient preference since she has regular follow up with her ophthalmologist. Hearing and body mass index were assessed and reviewed.   During the course of the visit the patient was educated and counseled about appropriate screening and preventive services including : fall prevention , diabetes screening, nutrition counseling, colorectal cancer screening, and recommended immunizations.    CC: The primary encounter diagnosis was Adenomatous polyp of colon, unspecified part of colon. Diagnoses of Prediabetes, Renal insufficiency, mild, Hyperlipidemia with target LDL less than 100, Breast cancer screening by mammogram, Obesity (BMI 30-39.9), Visit for preventive health examination, Colicky RUQ abdominal pain, and Primary hypertension were also pertinent to this visit.   1) HAD COVID 19 in November.  Received MoAb infusion due to history of asthma. Has had persistent "phantom  smell"  described as "sulfur" which affected appetite,  but has improved since then.. the fatigue was persistent for a few weeks  2) works full time in [place] for [employer] .  Not exercising but physically active.    History Donna Richards has a past medical history of Allergy, Arthritis, Asthma, Asthma, chronic (06/09/2014), GERD (gastroesophageal reflux disease), Hyperlipidemia, Inflammatory polyps of colon ([place]), Migraines, Stroke (  [place]) (1992), and UTI (urinary tract infection).   She has a past surgical history that includes Bilateral oophorectomy and Abdominal hysterectomy (1996).   Her family history  includes COPD in her father; Diabetes in her maternal grandmother and mother; Heart disease in her father, maternal grandfather, and mother; Hyperlipidemia in her mother; Kidney disease in her mother; Stroke in her paternal grandmother.She reports that she has never smoked. She has never used smokeless tobacco. She reports that she does not drink alcohol and does not use drugs.  Outpatient Medications Prior to Visit  Medication Sig Dispense Refill   albuterol (PROVENTIL HFA;VENTOLIN HFA) 108 (90 Base) MCG/ACT inhaler Inhale into the lungs.     aspirin EC 81 MG tablet Take 81 mg by mouth every other day.     cholecalciferol (VITAMIN D3) 25 MCG (1000 UNIT) tablet Take 1,000 Units by mouth daily.     fluticasone (FLONASE) 50 MCG/ACT nasal spray instill 1 spray into each nostril twice a day 16 g 5   hydrochlorothiazide (HYDRODIURIL) 25 MG tablet TAKE 1 TABLET(25 MG) BY MOUTH DAILY 90 tablet 1   pantoprazole (PROTONIX) 40 MG tablet TAKE 1 TABLET(40 MG) BY MOUTH DAILY 90 tablet 1   PULMICORT FLEXHALER 180 MCG/ACT inhaler Inhale 180 mcg into the lungs as needed.  1   RA ALLERGY RELIEF 180 MG tablet take 1 tablet by mouth once daily 30 tablet 11   rosuvastatin (CRESTOR) 5 MG tablet TAKE 1 TABLET(5 MG) BY MOUTH DAILY 90 tablet 1   No facility-administered medications prior to visit.    Review of Systems  Patient denies headache, fevers, malaise, unintentional weight loss, skin rash, eye pain, sinus congestion and sinus pain, sore throat, dysphagia,  hemoptysis , cough, dyspnea, wheezing, chest pain, palpitations, orthopnea, edema, abdominal pain, nausea, melena, diarrhea, constipation, flank pain, dysuria, hematuria, urinary  Frequency, nocturia, numbness, tingling, seizures,  Focal weakness, Loss of consciousness,  Tremor, insomnia, depression, anxiety, and suicidal ideation.     Objective:  BP 112/64 (BP Location: Left Arm, Patient Position: Sitting, Cuff Size: Normal)   Pulse 77   Temp (!) 96.6 F  (35.9 C) (Temporal)   Ht '5\' 5"'$  (1.651 m)   Wt 200 lb (90.7 kg)   SpO2 96%   BMI 33.28 kg/m   Physical Exam  General appearance: alert, cooperative and appears stated age Head: Normocephalic, without obvious abnormality, atraumatic Eyes: conjunctivae/corneas clear. PERRL, EOM's intact. Fundi benign. Ears: normal TM's and external ear canals both ears Nose: Nares normal. Septum midline. Mucosa normal. No drainage or sinus tenderness. Throat: lips, mucosa, and tongue normal; teeth and gums normal Neck: no adenopathy, no carotid bruit, no JVD, supple, symmetrical, trachea midline and thyroid not enlarged, symmetric, no tenderness/mass/nodules Lungs: clear to auscultation bilaterally Breasts: normal appearance, no masses or tenderness Heart: regular rate and rhythm, S1, S2 normal, no murmur, click, rub or gallop Abdomen: soft, non-tender; bowel sounds normal; no masses,  no organomegaly Extremities: extremities normal, atraumatic, no cyanosis or edema Pulses: 2+ and symmetric Skin: Skin color, texture, turgor normal. No rashes or lesions Neurologic: Alert and oriented X 3, normal strength and tone. Normal symmetric reflexes. Normal coordination and gait.      Assessment & Plan:   Problem List Items Addressed This Visit       Unprioritized   Obesity (BMI 30-39.9)    With prediabetes by last a1c. Participation Im regular exercise  and dherenece to low GI diet recommended .  She is disinclined to exercise regularly due  to bilateral  knee pain       Adenomatous colon polyp - Primary   Visit for preventive health examination    age appropriate education and counseling updated, referrals for preventative services and immunizations addressed, dietary and smoking counseling addressed, most recent labs reviewed.  I have personally reviewed and have noted:   1) the patient's medical and social history 2) The pt's use of alcohol, tobacco, and illicit drugs 3) The patient's current  medications and supplements 4) Functional ability including ADL's, fall risk, home safety risk, hearing and visual impairment 5) Diet and physical activities 6) Evidence for depression or mood disorder 7) The patient's height, weight, and BMI have been recorded in the chart   I have made referrals, and provided counseling and education based on review of the above      Renal insufficiency, mild    Ay be due to fasting state.  Will return for nonfasting labs   Lab Results  Component Value Date   CREATININE 1.05 12/03/2020   Lab Results  Component Value Date   NA 140 12/03/2020   K 3.9 12/03/2020   CL 100 12/03/2020   CO2 32 12/03/2020         Relevant Orders   Basic metabolic panel   Hyperlipidemia with target LDL less than 100   Relevant Orders   Lipid panel (Completed)   TSH (Completed)   Prediabetes     A1c continues to  suggest that she is at risk for developing diabetes.  Strongly recommend she lose 20 lbs this year following a glycemic index diet and participating regularly in an aerobic  exercise activity.   Lab Results  Component Value Date   HGBA1C 5.9 12/03/2020          Relevant Orders   Comprehensive metabolic panel (Completed)   Hemoglobin A1c (Completed)   Hypertension    Well controlled on current regimen of hctz  Renal function has dipped slightly,  GFR < 60 ml/min,  Will repeat when not fasting   Lab Results  Component Value Date   CREATININE 1.05 12/03/2020   . Lab Results  Component Value Date   NA 140 12/03/2020   K 3.9 12/03/2020   CL 100 12/03/2020   CO2 32 [id number]         Colicky RUQ abdominal pain    Patient's symptoms are infrequent, and there has been no nnvoluntary weight loss of liver enzyme elevation.  GI referral deferred for now per patient preference.      Other Visit Diagnoses     Breast cancer screening by mammogram       Relevant Orders   MM 3D SCREEN BREAST BILATERAL       I am having Donna Richards  maintain her RA Allergy Relief, fluticasone, Pulmicort Flexhaler, albuterol, cholecalciferol, aspirin EC, pantoprazole, hydrochlorothiazide, and rosuvastatin.  No orders of the defined types were placed in this encounter.   There are no discontinued medications.  Follow-up: No follow-ups on file.   Crecencio Mc, MD

## 2020-12-03 NOTE — Patient Instructions (Signed)
The CDC DOES recommend one more pneumonia vaccine for you because of your asthma:  either the PPSV 23 or the PCV 20  Both are available here or at your pharmacy  I recommend trying tog et 30 minutes of walking in daily   Your annual mammogram has been ordered and is due in late January 2023.   You are encouraged (required) to call to make your appointment at [facility] [phone]

## 2020-12-05 NOTE — Assessment & Plan Note (Signed)
Ay be due to fasting state.  Will return for nonfasting labs   Lab Results  Component Value Date   CREATININE 1.05 12/03/2020   Lab Results  Component Value Date   NA 140 12/03/2020   K 3.9 12/03/2020   CL 100 12/03/2020   CO2 32 12/03/2020

## 2020-12-05 NOTE — Assessment & Plan Note (Signed)

## 2020-12-05 NOTE — Progress Notes (Signed)
Your thyroid ,cholesterol,  and liver function are normal, and you are still prediabetic . Your other labs are normal, except for a slight drop in your GFR (kidney filtration rate)  .  Please make sure you are drinking at least 60 ounces of water daily (enough to make your urine clear in color) and plan to repeat the test in a non fasting state in one month   Regards,   Deborra Medina, MD        .   Regards,  Dr. Derrel Nip

## 2020-12-05 NOTE — Assessment & Plan Note (Signed)
A1c continues to  suggest that she is at risk for developing diabetes.  Strongly recommend she lose 20 lbs this year following a glycemic index diet and participating regularly in an aerobic  exercise activity.   Lab Results  Component Value Date   HGBA1C 5.9 12/03/2020

## 2020-12-05 NOTE — Addendum Note (Signed)
Addended by: Crecencio Mc on: 12/05/2020 11:43 PM   Modules accepted: Orders

## 2020-12-05 NOTE — Assessment & Plan Note (Addendum)
Well controlled on current regimen of hctz  Renal function has dipped slightly,  GFR < 60 ml/min,  Will repeat when not fasting   Lab Results  Component Value Date   CREATININE 1.05 12/03/2020   . Lab Results  Component Value Date   NA 140 12/03/2020   K 3.9 12/03/2020   CL 100 12/03/2020   CO2 32 12/03/2020

## 2020-12-05 NOTE — Assessment & Plan Note (Signed)
Patient's symptoms are infrequent, and there has been no nnvoluntary weight loss of liver enzyme elevation.  GI referral deferred for now per patient preference.

## 2021-01-05 ENCOUNTER — Other Ambulatory Visit: Payer: Self-pay

## 2021-01-05 ENCOUNTER — Other Ambulatory Visit (INDEPENDENT_AMBULATORY_CARE_PROVIDER_SITE_OTHER): Payer: Medicare HMO

## 2021-01-05 DIAGNOSIS — N289 Disorder of kidney and ureter, unspecified: Secondary | ICD-10-CM

## 2021-01-05 LAB — BASIC METABOLIC PANEL
BUN: 14 mg/dL (ref 6–23)
CO2: 32 mEq/L (ref 19–32)
Calcium: 9.7 mg/dL (ref 8.4–10.5)
Chloride: 102 mEq/L (ref 96–112)
Creatinine, Ser: 0.93 mg/dL (ref 0.40–1.20)
GFR: 62.76 mL/min (ref >60.00)
Glucose, Bld: 92 mg/dL (ref 70–99)
Potassium: 3.6 mEq/L (ref 3.5–5.1)
Sodium: 141 mEq/L (ref 135–145)

## 2021-02-15 DIAGNOSIS — D2262 Melanocytic nevi of left upper limb, including shoulder: Secondary | ICD-10-CM | POA: Diagnosis not present

## 2021-02-15 DIAGNOSIS — D2271 Melanocytic nevi of right lower limb, including hip: Secondary | ICD-10-CM | POA: Diagnosis not present

## 2021-02-15 DIAGNOSIS — D2272 Melanocytic nevi of left lower limb, including hip: Secondary | ICD-10-CM | POA: Diagnosis not present

## 2021-02-15 DIAGNOSIS — D2261 Melanocytic nevi of right upper limb, including shoulder: Secondary | ICD-10-CM | POA: Diagnosis not present

## 2021-02-15 DIAGNOSIS — Z85828 Personal history of other malignant neoplasm of skin: Secondary | ICD-10-CM | POA: Diagnosis not present

## 2021-04-19 ENCOUNTER — Other Ambulatory Visit: Payer: Self-pay

## 2021-04-19 MED ORDER — PANTOPRAZOLE SODIUM 40 MG PO TBEC: 40.0000 mg | DELAYED_RELEASE_TABLET | Freq: Every day | ORAL | 1 refills | Status: DC

## 2021-04-19 MED ORDER — HYDROCHLOROTHIAZIDE 25 MG PO TABS: 25.0000 mg | ORAL_TABLET | Freq: Every day | ORAL | 1 refills | Status: DC

## 2021-05-02 ENCOUNTER — Other Ambulatory Visit: Payer: Self-pay

## 2021-05-02 MED ORDER — ROSUVASTATIN CALCIUM 5 MG PO TABS: ORAL_TABLET | ORAL | 3 refills | Status: DC

## 2021-06-09 DIAGNOSIS — Z1231 Encounter for screening mammogram for malignant neoplasm of breast: Secondary | ICD-10-CM | POA: Diagnosis not present

## 2021-07-12 DIAGNOSIS — G4733 Obstructive sleep apnea (adult) (pediatric): Secondary | ICD-10-CM | POA: Diagnosis not present

## 2021-07-12 DIAGNOSIS — J4520 Mild intermittent asthma, uncomplicated: Secondary | ICD-10-CM | POA: Diagnosis not present

## 2021-07-12 DIAGNOSIS — J310 Chronic rhinitis: Secondary | ICD-10-CM | POA: Diagnosis not present

## 2021-07-17 ENCOUNTER — Other Ambulatory Visit: Payer: Self-pay | Admitting: Internal Medicine

## 2021-08-04 DIAGNOSIS — G4733 Obstructive sleep apnea (adult) (pediatric): Secondary | ICD-10-CM | POA: Diagnosis not present

## 2021-08-09 ENCOUNTER — Telehealth: Payer: Self-pay | Admitting: Internal Medicine

## 2021-08-09 NOTE — Telephone Encounter (Signed)
Copied from [place] [id number]. Topic: Medicare AWV >> Aug 09, 2021 11:05 AM Harris-Coley, Hannah Beat wrote: Reason for CRM: Left message for patient to schedule Annual Wellness Visit.  Please schedule with Nurse Health Advisor Denisa O'Brien-Blaney, LPN at [facility].  Please call [phone] ask for [facility]

## 2021-08-11 ENCOUNTER — Ambulatory Visit (INDEPENDENT_AMBULATORY_CARE_PROVIDER_SITE_OTHER): Payer: Medicare HMO

## 2021-08-11 VITALS — Ht 65.0 in | Wt 200.0 lb

## 2021-08-11 DIAGNOSIS — Z0000 Encounter for general adult medical examination without abnormal findings: Secondary | ICD-10-CM | POA: Diagnosis not present

## 2021-08-11 NOTE — Patient Instructions (Addendum)
  Donna Richards , Thank you for taking time to come for your Medicare Wellness Visit. I appreciate your ongoing commitment to your health goals. Please review the following plan we discussed and let me know if I can assist you in the future.   These are the goals we discussed:  Goals       Patient Stated     Weight goal 175lb (pt-stated)      Walk more for exercise. Portion control meals. Stay hydrated.        This is a list of the screening recommended for you and due dates:  Health Maintenance  Topic Date Due   Mammogram  03/23/2021   Zoster (Shingles) Vaccine (1 of 2) 11/11/2021*   Pneumonia Vaccine (2 - PPSV23 if available, else PCV20) 08/12/2022*   Flu Shot  10/18/2021   Colon Cancer Screening  06/15/2022   Tetanus Vaccine  07/27/2022   DEXA scan (bone density measurement)  Completed   Hepatitis C Screening: USPSTF Recommendation to screen - Ages 52-79 yo.  Completed   HPV Vaccine  Aged Out   COVID-19 Vaccine  Discontinued  *Topic was postponed. The date shown is not the original due date.

## 2021-08-11 NOTE — Progress Notes (Signed)
Subjective:   Donna Richards is a 71 y.o. female who presents for Medicare Annual (Subsequent) preventive examination.  Review of Systems    No ROS.  Medicare Wellness Virtual Visit.  Visual/audio telehealth visit, UTA vital signs.   See social history for additional risk factors.   Cardiac Risk Factors include: advanced age (>74mn, >>49women)     Objective:    Today's Vitals   08/11/21 1627  Weight: 200 lb (90.7 kg)  Height: '5\' 5"'$  (1.651 m)   Body mass index is 33.28 kg/m.     08/11/2021    2:55 PM 08/05/2020    3:03 PM 08/13/2019    3:45 PM  Advanced Directives  Does Patient Have a Medical Advance Directive? No No No  Would patient like information on creating a medical advance directive? No - Patient declined No - Patient declined Yes (MAU/Ambulatory/Procedural Areas - Information given)    Current Medications (verified) Outpatient Encounter Medications as of 08/11/2021  Medication Sig   albuterol (PROVENTIL HFA;VENTOLIN HFA) 108 (90 Base) MCG/ACT inhaler Inhale into the lungs.   aspirin EC 81 MG tablet Take 81 mg by mouth every other day.   cholecalciferol (VITAMIN D3) 25 MCG (1000 UNIT) tablet Take 1,000 Units by mouth daily.   fluticasone (FLONASE) 50 MCG/ACT nasal spray instill 1 spray into each nostril twice a day   hydrochlorothiazide (HYDRODIURIL) 25 MG tablet TAKE 1 TABLET(25 MG) BY MOUTH DAILY   pantoprazole (PROTONIX) 40 MG tablet Take 1 tablet (40 mg total) by mouth daily.   PULMICORT FLEXHALER 180 MCG/ACT inhaler Inhale 180 mcg into the lungs as needed.   RA ALLERGY RELIEF 180 MG tablet take 1 tablet by mouth once daily   rosuvastatin (CRESTOR) 5 MG tablet TAKE 1 TABLET(5 MG) BY MOUTH DAILY   No facility-administered encounter medications on file as of 08/11/2021.    Allergies (verified) Eggs or egg-derived products   History: Past Medical History:  Diagnosis Date   Allergy    Arthritis    Asthma    Asthma, chronic 06/09/2014   [facility]  [facility] with no pulmonology follow up sinc ethen.  Using flovent,  Which is his no longer covered by insurance.     GERD (gastroesophageal reflux disease)    Hyperlipidemia    Inflammatory polyps of colon ([place]    Migraines    [place]   UTI (urinary tract infection)    Past Surgical History:  Procedure Laterality Date   ABDOMINAL HYSTERECTOMY  1996   endometriosis   BILATERAL OOPHORECTOMY     endometriosis   Family History  Problem Relation Age of Onset   Hyperlipidemia Mother    Heart disease Mother    Kidney disease Mother    Diabetes Mother    Heart disease Father    COPD Father    Diabetes Maternal Grandmother    Heart disease Maternal Grandfather    Stroke Paternal Grandmother    Social History   Socioeconomic History   Marital status: Divorced    Spouse name: Not on file   Number of children: Not on file   Years of education: Not on file   Highest education level: Not on file  Occupational History   Not on file  Tobacco Use   Smoking status: Never   Smokeless tobacco: Never  Substance and Sexual Activity   Alcohol use: No   Drug use: No   Sexual activity: Yes  Other Topics Concern   Not on file  Social History Narrative   Not on file   Social Determinants of Health   Financial Resource Strain: Low Risk    Difficulty of Paying Living Expenses: Not hard at all  Food Insecurity: No Food Insecurity   Worried About Charity fundraiser in the Last Year: Never true   [place] in the Last Year: Never true  Transportation Needs: No Transportation Needs   Lack of Transportation (Medical): No   Lack of Transportation (Non-Medical): No  Physical Activity: Not on file  Stress: No Stress Concern Present   Feeling of Stress : Not at all  Social Connections: Unknown   Frequency of Communication with Friends and Family: More than three times a week   Frequency of Social Gatherings with Friends and  Family: More than three times a week   Attends Religious Services: Not on Electrical engineer or Organizations: Not on file   Attends Archivist Meetings: Not on file   Marital Status: Not on file    Tobacco Counseling Counseling given: Not Answered   Clinical Intake:  Pre-visit preparation completed: Yes        Diabetes: No  How often do you need to have someone help you when you read instructions, pamphlets, or other written materials from your doctor or pharmacy?: 1 - Never  Interpreter Needed?: No    Activities of Daily Living    08/11/2021    2:57 PM  In your present state of health, do you have any difficulty performing the following activities:  Hearing? 0  Vision? 0  Difficulty concentrating or making decisions? 0  Walking or climbing stairs? 0  Dressing or bathing? 0  Doing errands, shopping? 0  Preparing Food and eating ? N  Using the Toilet? N  In the past six months, have you accidently leaked urine? N  Do you have problems with loss of bowel control? N  Managing your Medications? N  Managing your Finances? N  Housekeeping or managing your Housekeeping? N    Patient Care Team: Crecencio Mc, MD as PCP - General (Internal Medicine)  Indicate any recent Medical Services you may have received from other than [facility] providers in the past year (date may be approximate).     Assessment:   This is a routine wellness examination for Donna Richards.  Virtual Visit via Telephone Note  I connected with  Donna Richards on 08/11/21 at  2:45 PM EDT by telephone and verified that I am speaking with the correct person using two identifiers.  Persons participating in the virtual visit: patient/Nurse Health Advisor   I discussed the limitations of performing an evaluation and management service by telehealth. We continued and completed visit with audio only. Some vital signs may be absent or patient reported.   Hearing/Vision screen Hearing  Screening - Comments:: Patient is able to hear conversational tones without difficulty. No issues reported. Vision Screening - Comments:: Followed by Dr. Gloriann Loan Wears corrective lenses They have seen their ophthalmologist in the last 12 months.    Dietary issues and exercise activities discussed:   Healthy diet Good water intake   Goals Addressed               This Visit's Progress     Patient Stated     Weight goal 175lb (pt-stated)        Walk more for exercise. Portion control meals. Stay hydrated.  Depression Screen    08/11/2021    2:53 PM 12/03/2020    8:15 AM 08/05/2020    2:59 PM 08/13/2019    3:51 PM 02/22/2018   10:11 AM 08/23/2016    8:31 AM  PHQ 2/9 Scores  PHQ - 2 Score 0 0 0 0 0 0  PHQ- 9 Score     1 1    Fall Risk    08/11/2021    2:57 PM 12/03/2020    8:14 AM 08/05/2020    3:04 PM 01/26/2020    2:32 PM 01/02/2020    8:06 AM  [place] in the past year? 0 0 0 0 0  Number falls in past yr: 0  0    Injury with Fall?   0    Follow up Falls evaluation completed Falls evaluation completed Falls evaluation completed Falls evaluation completed Falls evaluation completed    FALL RISK PREVENTION PERTAINING TO THE HOME: Home free of loose throw rugs in walkways, pet beds, electrical cords, etc? Yes  Adequate lighting in your home to reduce risk of falls? Yes   ASSISTIVE DEVICES UTILIZED TO PREVENT FALLS: Life alert? No  Use of a cane, walker or w/c? No   TIMED UP AND GO: Was the test performed? No .   Cognitive Function:  Patient is alert and oriented x3.       Immunizations Immunization History  Administered Date(s) Administered   Influenza Inj Mdck Quad Pf 01/13/2019   Influenza, Quadrivalent, Recombinant, Inj, Pf 01/02/2017, 01/25/2018   Pneumococcal Conjugate-13 08/23/2016   Tdap 07/26/2012   Zoster, Live 07/28/2013   Pneumococcal vaccine status: Declined,  Education has been provided regarding the importance of this vaccine  but patient still declined. Advised may receive this vaccine at local pharmacy or Health Dept. Aware to provide a copy of the vaccination record if obtained from local pharmacy or Health Dept. Verbalized acceptance and understanding.   Shingrix Completed?: No.    Education has been provided regarding the importance of this vaccine. Patient has been advised to call insurance company to determine out of pocket expense if they have not yet received this vaccine. Advised may also receive vaccine at local pharmacy or Health Dept. Verbalized acceptance and understanding.  Screening Tests Health Maintenance  Topic Date Due   MAMMOGRAM  03/23/2021   Zoster Vaccines- Shingrix (1 of 2) 11/11/2021 (Originally 08/10/2001)   Pneumonia Vaccine 78+ Years old (2 - PPSV23 if available, else PCV20) 08/12/2022 (Originally 08/23/2017)   INFLUENZA VACCINE  10/18/2021   COLONOSCOPY (Pts 45-8yr Insurance coverage will need to be confirmed)  06/15/2022   TETANUS/TDAP  07/27/2022   DEXA SCAN  Completed   Hepatitis C Screening  Completed   HPV VACCINES  Aged Out   COVID-19 Vaccine  Discontinued   Health Maintenance Health Maintenance Due  Topic Date Due   MAMMOGRAM  03/23/2021   Lung Cancer Screening: (Low Dose CT Chest recommended if Age 70-80years, 30 pack-year currently smoking OR have quit w/in 15years.) does not qualify.   Vision Screening: Recommended annual ophthalmology exams for early detection of glaucoma and other disorders of the eye.  Dental Screening: Recommended annual dental exams for proper oral hygiene  Community Resource Referral / Chronic Care Management: CRR required this visit?  No   CCM required this visit?  No      Plan:   Keep all routine maintenance appointments.   I have personally reviewed and noted the following in the  patient's chart:   Medical and social history Use of alcohol, tobacco or illicit drugs  Current medications and supplements including opioid  prescriptions.  Functional ability and status Nutritional status Physical activity Advanced directives List of other physicians Hospitalizations, surgeries, and ER visits in previous 12 months Vitals Screenings to include cognitive, depression, and falls Referrals and appointments  In addition, I have reviewed and discussed with patient certain preventive protocols, quality metrics, and best practice recommendations. A written personalized care plan for preventive services as well as general preventive health recommendations were provided to patient.     Varney Biles, LPN   [id number]

## 2021-09-27 DIAGNOSIS — G4733 Obstructive sleep apnea (adult) (pediatric): Secondary | ICD-10-CM | POA: Diagnosis not present

## 2021-10-19 ENCOUNTER — Other Ambulatory Visit: Payer: Self-pay | Admitting: Internal Medicine

## 2021-10-19 DIAGNOSIS — J4520 Mild intermittent asthma, uncomplicated: Secondary | ICD-10-CM | POA: Diagnosis not present

## 2021-10-19 DIAGNOSIS — J301 Allergic rhinitis due to pollen: Secondary | ICD-10-CM | POA: Diagnosis not present

## 2021-10-19 DIAGNOSIS — G4733 Obstructive sleep apnea (adult) (pediatric): Secondary | ICD-10-CM | POA: Diagnosis not present

## 2021-10-19 DIAGNOSIS — Z9989 Dependence on other enabling machines and devices: Secondary | ICD-10-CM | POA: Diagnosis not present

## 2021-10-28 DIAGNOSIS — G4733 Obstructive sleep apnea (adult) (pediatric): Secondary | ICD-10-CM | POA: Diagnosis not present

## 2021-11-28 DIAGNOSIS — G4733 Obstructive sleep apnea (adult) (pediatric): Secondary | ICD-10-CM | POA: Diagnosis not present

## 2021-12-06 ENCOUNTER — Ambulatory Visit (INDEPENDENT_AMBULATORY_CARE_PROVIDER_SITE_OTHER): Payer: Medicare HMO | Admitting: Internal Medicine

## 2021-12-06 ENCOUNTER — Encounter: Payer: Self-pay | Admitting: Internal Medicine

## 2021-12-06 VITALS — BP 126/80 | HR 71 | Temp 97.8°F | Ht 65.0 in | Wt 198.0 lb

## 2021-12-06 DIAGNOSIS — G4733 Obstructive sleep apnea (adult) (pediatric): Secondary | ICD-10-CM

## 2021-12-06 DIAGNOSIS — Z0000 Encounter for general adult medical examination without abnormal findings: Secondary | ICD-10-CM

## 2021-12-06 DIAGNOSIS — R5383 Other fatigue: Secondary | ICD-10-CM | POA: Diagnosis not present

## 2021-12-06 DIAGNOSIS — R7303 Prediabetes: Secondary | ICD-10-CM

## 2021-12-06 DIAGNOSIS — E538 Deficiency of other specified B group vitamins: Secondary | ICD-10-CM | POA: Diagnosis not present

## 2021-12-06 DIAGNOSIS — E669 Obesity, unspecified: Secondary | ICD-10-CM | POA: Diagnosis not present

## 2021-12-06 DIAGNOSIS — E785 Hyperlipidemia, unspecified: Secondary | ICD-10-CM | POA: Diagnosis not present

## 2021-12-06 DIAGNOSIS — E559 Vitamin D deficiency, unspecified: Secondary | ICD-10-CM | POA: Diagnosis not present

## 2021-12-06 MED ORDER — ZOSTER VAC RECOMB ADJUVANTED 50 MCG/0.5ML IM SUSR: 0.5000 mL | Freq: Once | INTRAMUSCULAR | 1 refills | Status: AC

## 2021-12-06 NOTE — Patient Instructions (Addendum)
The phone number for [facility] for mammogram   is [phone]    you are due in late March   The ShingRx vaccine is now available  for free at your pharmacy and ADVISED for all interested adults over 50 to prevent shingles.  PLEASE GET YOUR FIRST DOSE THIS MONTH AND YOUR SECOND DOSE BEFORE THE END OF March    You have lost 13 lbs since 2019,  2 since last year . Good steady progress!

## 2021-12-06 NOTE — Progress Notes (Unsigned)
Patient ID: AMIAYAH GIEBEL, female    DOB: [birth year]  Age: 71 y.o. MRN: [id number]  The patient is here for annual preventive  examination and management of other chronic and acute problems.   The risk factors are reflected in the social history.  The roster of all physicians providing medical care to patient - is listed in the Snapshot section of the chart.  Activities of daily living:  The patient is 100% independent in all ADLs: dressing, toileting, feeding as well as independent mobility  Home safety : The patient has smoke detectors in the home. They wear seatbelts.  There are no firearms at home. There is no violence in the home.   There is no risks for hepatitis, STDs or HIV. There is no   history of blood transfusion. They have no travel history to infectious disease endemic areas of the world.  The patient has seen their dentist in the last six month. They have seen their eye doctor in the last year. They admit to slight hearing difficulty with regard to whispered voices and some television programs.  They have deferred audiologic testing in the last year.  They do not  have excessive sun exposure. Discussed the need for sun protection: hats, long sleeves and use of sunscreen if there is significant sun exposure.   Diet: the importance of a healthy diet is discussed. They do have a healthy diet.  The benefits of regular aerobic exercise were discussed. She walks 4 times per week ,  20 minutes.   Depression screen: there are no signs or vegative symptoms of depression- irritability, change in appetite, anhedonia, sadness/tearfullness.  Cognitive assessment: the patient manages all their financial and personal affairs and is actively engaged. They could relate day,date,year and events; recalled 2/3 objects at 3 minutes; performed clock-face test normally.  The following portions of the patient's history were reviewed and updated as appropriate: allergies, current medications, past  family history, past medical history,  past surgical history, past social history  and problem list.  Visual acuity was not assessed per patient preference since she has regular follow up with her ophthalmologist. Hearing and body mass index were assessed and reviewed.   During the course of the visit the patient was educated and counseled about appropriate screening and preventive services including : fall prevention , diabetes screening, nutrition counseling, colorectal cancer screening, and recommended immunizations.    CC: There were no encounter diagnoses.  History Trianna has a past medical history of Allergy, Arthritis, Asthma, Asthma, chronic (06/09/2014), GERD (gastroesophageal reflux disease), Hyperlipidemia, Inflammatory polyps of colon ([place]), Migraines, Stroke ([place]) (1992), and UTI (urinary tract infection).   She has a past surgical history that includes Bilateral oophorectomy and Abdominal hysterectomy (1996).   Her family history includes COPD in her father; Diabetes in her maternal grandmother and mother; Heart disease in her father, maternal grandfather, and mother; Hyperlipidemia in her mother; Kidney disease in her mother; Stroke in her paternal grandmother.She reports that she has never smoked. She has never used smokeless tobacco. She reports that she does not drink alcohol and does not use drugs.  Outpatient Medications Prior to Visit  Medication Sig Dispense Refill   albuterol (PROVENTIL HFA;VENTOLIN HFA) 108 (90 Base) MCG/ACT inhaler Inhale into the lungs.     aspirin EC 81 MG tablet Take 81 mg by mouth every other day.     cholecalciferol (VITAMIN D3) 25 MCG (1000 UNIT) tablet Take 1,000 Units by mouth daily.     fluticasone (  FLONASE) 50 MCG/ACT nasal spray instill 1 spray into each nostril twice a day 16 g 5   hydrochlorothiazide (HYDRODIURIL) 25 MG tablet TAKE 1 TABLET(25 MG) BY MOUTH DAILY 90 tablet 1   pantoprazole (PROTONIX) 40 MG tablet TAKE 1 TABLET(40 MG) BY  MOUTH DAILY 90 tablet 1   PULMICORT FLEXHALER 180 MCG/ACT inhaler Inhale 180 mcg into the lungs as needed.  1   RA ALLERGY RELIEF 180 MG tablet take 1 tablet by mouth once daily 30 tablet 11   rosuvastatin (CRESTOR) 5 MG tablet TAKE 1 TABLET(5 MG) BY MOUTH DAILY 90 tablet 3   No facility-administered medications prior to visit.    Review of Systems  Objective:  BP 126/80 (BP Location: Left Arm, Patient Position: Sitting, Cuff Size: Normal)   Pulse 71   Temp 97.8 F (36.6 C) (Oral)   Ht '5\' 5"'$  (1.651 m)   Wt 198 lb (89.8 kg)   SpO2 99%   BMI 32.95 kg/m   Physical Exam  Physical Exam   Assessment & Plan:   Problem List Items Addressed This Visit   None   I am having Iline C. Kooyman maintain her RA Allergy Relief, fluticasone, Pulmicort Flexhaler, albuterol, cholecalciferol, aspirin EC, rosuvastatin, hydrochlorothiazide, and pantoprazole.  No orders of the defined types were placed in this encounter.   There are no discontinued medications.  Follow-up: No follow-ups on file.   Crecencio Mc, MD

## 2021-12-06 NOTE — Assessment & Plan Note (Signed)
Now on CPAP since May , managed by Dr. Ginette Pitman.

## 2021-12-07 LAB — B12 AND FOLATE PANEL
Folate: 9.4 ng/mL (ref >5.9)
Vitamin B-12: 198 pg/mL — ABNORMAL LOW (ref 211–911)

## 2021-12-07 LAB — CBC WITH DIFFERENTIAL/PLATELET
Basophils Absolute: 0.1 10*3/uL (ref 0.0–0.1)
Basophils Relative: 1.2 % (ref 0.0–3.0)
Eosinophils Absolute: 0.1 10*3/uL (ref 0.0–0.7)
Eosinophils Relative: 2.5 % (ref 0.0–5.0)
HCT: 41.1 % (ref 36.0–46.0)
Hemoglobin: 13.7 g/dL (ref 12.0–15.0)
Lymphocytes Relative: 23.8 % (ref 12.0–46.0)
Lymphs Abs: 1.2 10*3/uL (ref 0.7–4.0)
MCHC: 33.4 g/dL (ref 30.0–36.0)
MCV: 88.9 fl (ref 78.0–100.0)
Monocytes Absolute: 0.5 10*3/uL (ref 0.1–1.0)
Monocytes Relative: 10.1 % (ref 3.0–12.0)
Neutro Abs: 3.3 10*3/uL (ref 1.4–7.7)
Neutrophils Relative %: 62.4 % (ref 43.0–77.0)
Platelets: 200 10*3/uL (ref 150.0–400.0)
RBC: 4.62 Mil/uL (ref 3.87–5.11)
RDW: 13.3 % (ref 11.5–15.5)
WBC: 5.2 10*3/uL (ref 4.0–10.5)

## 2021-12-07 LAB — COMPREHENSIVE METABOLIC PANEL
ALT: 15 U/L (ref 0–35)
AST: 18 U/L (ref 0–37)
Albumin: 4.3 g/dL (ref 3.5–5.2)
Alkaline Phosphatase: 35 U/L — ABNORMAL LOW (ref 39–117)
BUN: 16 mg/dL (ref 6–23)
CO2: 32 mEq/L (ref 19–32)
Calcium: 10.2 mg/dL (ref 8.4–10.5)
Chloride: 98 mEq/L (ref 96–112)
Creatinine, Ser: 0.96 mg/dL (ref 0.40–1.20)
GFR: 60.02 mL/min (ref >60.00)
Glucose, Bld: 77 mg/dL (ref 70–99)
Potassium: 3.6 mEq/L (ref 3.5–5.1)
Sodium: 140 mEq/L (ref 135–145)
Total Bilirubin: 1.2 mg/dL (ref 0.2–1.2)
Total Protein: 6.8 g/dL (ref 6.0–8.3)

## 2021-12-07 LAB — TSH: TSH: 1.35 u[IU]/mL (ref 0.35–5.50)

## 2021-12-07 LAB — HEMOGLOBIN A1C: Hgb A1c MFr Bld: 5.8 % (ref 4.6–6.5)

## 2021-12-07 LAB — LIPID PANEL
Cholesterol: 158 mg/dL (ref 0–200)
HDL: 72.8 mg/dL (ref >39.00)
LDL Cholesterol: 71 mg/dL (ref 0–99)
NonHDL: 84.93
Total CHOL/HDL Ratio: 2
Triglycerides: 72 mg/dL (ref 0.0–149.0)
VLDL: 14.4 mg/dL (ref 0.0–40.0)

## 2021-12-07 LAB — VITAMIN D 25 HYDROXY (VIT D DEFICIENCY, FRACTURES): VITD: 86.6 ng/mL (ref 30.00–100.00)

## 2021-12-07 NOTE — Assessment & Plan Note (Signed)

## 2021-12-07 NOTE — Assessment & Plan Note (Signed)
13 lb weight loss since 2019.  I have congratulated her in reduction of   BMI and encouraged  Continued weight loss with goal of 10% of body weigh over the next 6 months using a low glycemic index diet and regular exercise a minimum of 5 days per week.

## 2021-12-07 NOTE — Assessment & Plan Note (Signed)
A1c continues to  suggest that she is at risk for developing diabetes.  Strongly recommend she lose 20 lbs this year following a glycemic index diet and participating regularly in an aerobic  exercise activity.   Lab Results  Component Value Date   HGBA1C 5.8 12/06/2021

## 2021-12-08 NOTE — Addendum Note (Signed)
Addended by: Crecencio Mc on: 12/08/2021 01:26 PM   Modules accepted: Orders

## 2021-12-08 NOTE — Assessment & Plan Note (Signed)
Resolved with supplementation 

## 2021-12-12 ENCOUNTER — Ambulatory Visit (INDEPENDENT_AMBULATORY_CARE_PROVIDER_SITE_OTHER): Payer: Medicare HMO

## 2021-12-12 DIAGNOSIS — E538 Deficiency of other specified B group vitamins: Secondary | ICD-10-CM

## 2021-12-12 MED ORDER — CYANOCOBALAMIN 1000 MCG/ML IJ SOLN
1000.0000 ug | Freq: Once | INTRAMUSCULAR | Status: AC
  Administered 2021-12-12: 1000 ug via INTRAMUSCULAR

## 2021-12-12 NOTE — Progress Notes (Signed)
Pt arrived for 1/4 B12 injection, given in L deltoid. Pt tolerated injection well, showed no signs of distress nor voiced any concerns.  °

## 2021-12-19 ENCOUNTER — Ambulatory Visit: Payer: Medicare HMO

## 2021-12-22 ENCOUNTER — Ambulatory Visit (INDEPENDENT_AMBULATORY_CARE_PROVIDER_SITE_OTHER): Payer: Medicare HMO

## 2021-12-22 DIAGNOSIS — E538 Deficiency of other specified B group vitamins: Secondary | ICD-10-CM | POA: Diagnosis not present

## 2021-12-22 MED ORDER — CYANOCOBALAMIN 1000 MCG/ML IJ SOLN
1000.0000 ug | Freq: Once | INTRAMUSCULAR | Status: AC
  Administered 2021-12-22: 1000 ug via INTRAMUSCULAR

## 2021-12-22 NOTE — Progress Notes (Signed)
Patient presented for B 12 injection to left deltoid, patient voiced no concerns nor showed any signs of distress during injection. 

## 2021-12-25 LAB — INTRINSIC FACTOR ANTIBODIES: Intrinsic Factor: POSITIVE — AB

## 2021-12-26 ENCOUNTER — Ambulatory Visit: Payer: Medicare HMO

## 2021-12-26 DIAGNOSIS — G4733 Obstructive sleep apnea (adult) (pediatric): Secondary | ICD-10-CM | POA: Diagnosis not present

## 2021-12-27 DIAGNOSIS — H524 Presbyopia: Secondary | ICD-10-CM | POA: Diagnosis not present

## 2021-12-28 DIAGNOSIS — G4733 Obstructive sleep apnea (adult) (pediatric): Secondary | ICD-10-CM | POA: Diagnosis not present

## 2021-12-29 ENCOUNTER — Ambulatory Visit (INDEPENDENT_AMBULATORY_CARE_PROVIDER_SITE_OTHER): Payer: Medicare HMO

## 2021-12-29 DIAGNOSIS — E538 Deficiency of other specified B group vitamins: Secondary | ICD-10-CM

## 2021-12-29 MED ORDER — CYANOCOBALAMIN 1000 MCG/ML IJ SOLN
1000.0000 ug | Freq: Once | INTRAMUSCULAR | Status: AC
  Administered 2021-12-29: 1000 ug via INTRAMUSCULAR

## 2021-12-29 NOTE — Progress Notes (Signed)
presents today for injection per MD orders. B12 injection administered IM in left Upper Arm. Administration without incident. Patient tolerated well.  Aigner Horseman,cma   

## 2022-01-02 ENCOUNTER — Ambulatory Visit: Payer: Medicare HMO

## 2022-01-05 ENCOUNTER — Ambulatory Visit (INDEPENDENT_AMBULATORY_CARE_PROVIDER_SITE_OTHER): Payer: Medicare HMO

## 2022-01-05 DIAGNOSIS — E538 Deficiency of other specified B group vitamins: Secondary | ICD-10-CM

## 2022-01-05 MED ORDER — CYANOCOBALAMIN 1000 MCG/ML IJ SOLN
1000.0000 ug | Freq: Once | INTRAMUSCULAR | Status: AC
  Administered 2022-01-05: 1000 ug via INTRAMUSCULAR

## 2022-01-05 NOTE — Progress Notes (Signed)
Patient presented for B 12 injection to left deltoid, patient voiced no concerns nor showed any signs of distress during injection   Donna Richards,cma

## 2022-01-12 ENCOUNTER — Ambulatory Visit: Payer: Medicare HMO

## 2022-01-20 ENCOUNTER — Other Ambulatory Visit: Payer: Self-pay | Admitting: Internal Medicine

## 2022-01-28 DIAGNOSIS — G4733 Obstructive sleep apnea (adult) (pediatric): Secondary | ICD-10-CM | POA: Diagnosis not present

## 2022-02-06 ENCOUNTER — Ambulatory Visit (INDEPENDENT_AMBULATORY_CARE_PROVIDER_SITE_OTHER): Payer: Medicare HMO

## 2022-02-06 DIAGNOSIS — E538 Deficiency of other specified B group vitamins: Secondary | ICD-10-CM

## 2022-02-06 MED ORDER — CYANOCOBALAMIN 1000 MCG/ML IJ SOLN
1000.0000 ug | Freq: Once | INTRAMUSCULAR | Status: AC
  Administered 2022-02-06: 1000 ug via INTRAMUSCULAR

## 2022-02-06 NOTE — Progress Notes (Signed)
Pt presented for her vitamin B12 injection. Pt was identified through 2 identifiers. Pt tolerated shot well in the left deltoid.  [place], CMA

## 2022-02-17 DIAGNOSIS — D225 Melanocytic nevi of trunk: Secondary | ICD-10-CM | POA: Diagnosis not present

## 2022-02-17 DIAGNOSIS — D2261 Melanocytic nevi of right upper limb, including shoulder: Secondary | ICD-10-CM | POA: Diagnosis not present

## 2022-02-17 DIAGNOSIS — D044 Carcinoma in situ of skin of scalp and neck: Secondary | ICD-10-CM | POA: Diagnosis not present

## 2022-02-17 DIAGNOSIS — D485 Neoplasm of uncertain behavior of skin: Secondary | ICD-10-CM | POA: Diagnosis not present

## 2022-02-17 DIAGNOSIS — D2272 Melanocytic nevi of left lower limb, including hip: Secondary | ICD-10-CM | POA: Diagnosis not present

## 2022-02-17 DIAGNOSIS — Z85828 Personal history of other malignant neoplasm of skin: Secondary | ICD-10-CM | POA: Diagnosis not present

## 2022-02-27 DIAGNOSIS — G4733 Obstructive sleep apnea (adult) (pediatric): Secondary | ICD-10-CM | POA: Diagnosis not present

## 2022-02-28 DIAGNOSIS — U071 COVID-19: Secondary | ICD-10-CM | POA: Diagnosis not present

## 2022-02-28 DIAGNOSIS — Z8709 Personal history of other diseases of the respiratory system: Secondary | ICD-10-CM | POA: Diagnosis not present

## 2022-02-28 DIAGNOSIS — Z03818 Encounter for observation for suspected exposure to other biological agents ruled out: Secondary | ICD-10-CM | POA: Diagnosis not present

## 2022-03-08 ENCOUNTER — Ambulatory Visit (INDEPENDENT_AMBULATORY_CARE_PROVIDER_SITE_OTHER): Payer: Medicare HMO

## 2022-03-08 DIAGNOSIS — E538 Deficiency of other specified B group vitamins: Secondary | ICD-10-CM | POA: Diagnosis not present

## 2022-03-08 DIAGNOSIS — D044 Carcinoma in situ of skin of scalp and neck: Secondary | ICD-10-CM | POA: Diagnosis not present

## 2022-03-08 MED ORDER — CYANOCOBALAMIN 1000 MCG/ML IJ SOLN
1000.0000 ug | Freq: Once | INTRAMUSCULAR | Status: AC
  Administered 2022-03-08: 1000 ug via INTRAMUSCULAR

## 2022-03-08 NOTE — Progress Notes (Addendum)
Pt presented for their vitamin B12 injection. Pt was identified through two identifiers. Pt tolerated shot well in their right deltoid.  

## 2022-03-26 DIAGNOSIS — G4733 Obstructive sleep apnea (adult) (pediatric): Secondary | ICD-10-CM | POA: Diagnosis not present

## 2022-03-30 DIAGNOSIS — G4733 Obstructive sleep apnea (adult) (pediatric): Secondary | ICD-10-CM | POA: Diagnosis not present

## 2022-04-10 ENCOUNTER — Ambulatory Visit (INDEPENDENT_AMBULATORY_CARE_PROVIDER_SITE_OTHER): Payer: Medicare HMO

## 2022-04-10 DIAGNOSIS — E538 Deficiency of other specified B group vitamins: Secondary | ICD-10-CM | POA: Diagnosis not present

## 2022-04-10 MED ORDER — CYANOCOBALAMIN 1000 MCG/ML IJ SOLN
1000.0000 ug | Freq: Once | INTRAMUSCULAR | Status: AC
  Administered 2022-04-10: 1000 ug via INTRAMUSCULAR

## 2022-04-10 NOTE — Progress Notes (Signed)
Pt presented for their vitamin B12 injection. Pt was identified through two identifiers. Pt tolerated shot well in their left  deltoid.  

## 2022-04-20 DIAGNOSIS — J4520 Mild intermittent asthma, uncomplicated: Secondary | ICD-10-CM | POA: Diagnosis not present

## 2022-04-20 DIAGNOSIS — E669 Obesity, unspecified: Secondary | ICD-10-CM | POA: Diagnosis not present

## 2022-04-20 DIAGNOSIS — G4733 Obstructive sleep apnea (adult) (pediatric): Secondary | ICD-10-CM | POA: Diagnosis not present

## 2022-04-26 DIAGNOSIS — Z85828 Personal history of other malignant neoplasm of skin: Secondary | ICD-10-CM | POA: Diagnosis not present

## 2022-04-30 DIAGNOSIS — G4733 Obstructive sleep apnea (adult) (pediatric): Secondary | ICD-10-CM | POA: Diagnosis not present

## 2022-05-01 ENCOUNTER — Telehealth: Payer: Self-pay | Admitting: Internal Medicine

## 2022-05-01 NOTE — Telephone Encounter (Signed)
Pt need a refill on Hydrochlorothiazide and rosuvastatin sent to [facility]

## 2022-05-02 ENCOUNTER — Other Ambulatory Visit: Payer: Self-pay

## 2022-05-02 MED ORDER — HYDROCHLOROTHIAZIDE 25 MG PO TABS: ORAL_TABLET | ORAL | 1 refills | Status: DC

## 2022-05-02 MED ORDER — ROSUVASTATIN CALCIUM 5 MG PO TABS: ORAL_TABLET | ORAL | 3 refills | Status: DC

## 2022-05-02 NOTE — Telephone Encounter (Signed)
sent

## 2022-05-11 ENCOUNTER — Ambulatory Visit (INDEPENDENT_AMBULATORY_CARE_PROVIDER_SITE_OTHER): Payer: Medicare HMO

## 2022-05-11 DIAGNOSIS — E538 Deficiency of other specified B group vitamins: Secondary | ICD-10-CM | POA: Diagnosis not present

## 2022-05-11 MED ORDER — CYANOCOBALAMIN 1000 MCG/ML IJ SOLN
1000.0000 ug | Freq: Once | INTRAMUSCULAR | Status: AC
  Administered 2022-05-11: 1000 ug via INTRAMUSCULAR

## 2022-05-11 NOTE — Progress Notes (Signed)
Patient arrived for her Vitamin B12. Patient was administered a B12 into her right deltoid. Patient tolerated the B12 injection well and did not show any signs of distress or voice any concerns.

## 2022-05-29 DIAGNOSIS — G4733 Obstructive sleep apnea (adult) (pediatric): Secondary | ICD-10-CM | POA: Diagnosis not present

## 2022-06-12 ENCOUNTER — Ambulatory Visit (INDEPENDENT_AMBULATORY_CARE_PROVIDER_SITE_OTHER): Payer: Medicare HMO

## 2022-06-12 DIAGNOSIS — E538 Deficiency of other specified B group vitamins: Secondary | ICD-10-CM | POA: Diagnosis not present

## 2022-06-12 MED ORDER — CYANOCOBALAMIN 1000 MCG/ML IJ SOLN
1000.0000 ug | Freq: Once | INTRAMUSCULAR | Status: AC
  Administered 2022-06-12: 1000 ug via INTRAMUSCULAR

## 2022-06-12 NOTE — Progress Notes (Signed)
presents today for injection per MD orders. B12 injection administered IM in left Upper Arm. Administration without incident. Patient tolerated well.  Shanyla Marconi,cma   

## 2022-06-21 DIAGNOSIS — Z1231 Encounter for screening mammogram for malignant neoplasm of breast: Secondary | ICD-10-CM | POA: Diagnosis not present

## 2022-06-21 LAB — HM MAMMOGRAPHY

## 2022-06-24 DIAGNOSIS — G4733 Obstructive sleep apnea (adult) (pediatric): Secondary | ICD-10-CM | POA: Diagnosis not present

## 2022-06-29 DIAGNOSIS — G4733 Obstructive sleep apnea (adult) (pediatric): Secondary | ICD-10-CM | POA: Diagnosis not present

## 2022-07-10 ENCOUNTER — Ambulatory Visit (INDEPENDENT_AMBULATORY_CARE_PROVIDER_SITE_OTHER): Payer: Medicare HMO

## 2022-07-10 DIAGNOSIS — E538 Deficiency of other specified B group vitamins: Secondary | ICD-10-CM

## 2022-07-10 MED ORDER — CYANOCOBALAMIN 1000 MCG/ML IJ SOLN
1000.0000 ug | Freq: Once | INTRAMUSCULAR | Status: AC
  Administered 2022-07-10: 1000 ug via INTRAMUSCULAR

## 2022-07-10 NOTE — Progress Notes (Signed)
Pt presented for their vitamin B12 injection. Pt was identified through two identifiers. Pt tolerated shot well in their right deltoid.  

## 2022-07-29 DIAGNOSIS — G4733 Obstructive sleep apnea (adult) (pediatric): Secondary | ICD-10-CM | POA: Diagnosis not present

## 2022-08-10 ENCOUNTER — Ambulatory Visit (INDEPENDENT_AMBULATORY_CARE_PROVIDER_SITE_OTHER): Payer: Medicare HMO

## 2022-08-10 DIAGNOSIS — E538 Deficiency of other specified B group vitamins: Secondary | ICD-10-CM | POA: Diagnosis not present

## 2022-08-10 MED ORDER — CYANOCOBALAMIN 1000 MCG/ML IJ SOLN
1000.0000 ug | Freq: Once | INTRAMUSCULAR | Status: AC
  Administered 2022-08-10: 1000 ug via INTRAMUSCULAR

## 2022-08-10 NOTE — Progress Notes (Signed)
Patient arrived for a B12 injection and it was administered into left deltoid. Patient tolerated the injection well and did not show any signs of distress or voice any concerns.

## 2022-08-29 DIAGNOSIS — G4733 Obstructive sleep apnea (adult) (pediatric): Secondary | ICD-10-CM | POA: Diagnosis not present

## 2022-09-11 ENCOUNTER — Ambulatory Visit (INDEPENDENT_AMBULATORY_CARE_PROVIDER_SITE_OTHER): Payer: Medicare HMO

## 2022-09-11 DIAGNOSIS — E538 Deficiency of other specified B group vitamins: Secondary | ICD-10-CM | POA: Diagnosis not present

## 2022-09-11 MED ORDER — CYANOCOBALAMIN 1000 MCG/ML IJ SOLN
1000.0000 ug | Freq: Once | INTRAMUSCULAR | Status: AC
  Administered 2022-09-11: 1000 ug via INTRAMUSCULAR

## 2022-09-11 NOTE — Progress Notes (Signed)
After obtaining consent, and per orders of Dr. Duncan Dull, injection of B-12 given IM in right deltoid by Valentino Nose. Patient tolerated injection well.

## 2022-09-18 ENCOUNTER — Ambulatory Visit (INDEPENDENT_AMBULATORY_CARE_PROVIDER_SITE_OTHER): Payer: Medicare HMO | Admitting: *Deleted

## 2022-09-18 VITALS — Ht 64.6 in | Wt 195.0 lb

## 2022-09-18 DIAGNOSIS — Z0000 Encounter for general adult medical examination without abnormal findings: Secondary | ICD-10-CM | POA: Diagnosis not present

## 2022-09-18 NOTE — Patient Instructions (Signed)
Donna Richards , Thank you for taking time to come for your Medicare Wellness Visit. I appreciate your ongoing commitment to your health goals. Please review the following plan we discussed and let me know if I can assist you in the future.   These are the goals we discussed:  Goals       Patient Stated      Watch what she eats, and continue to lose weight, to start an exercise program      Weight goal 175lb (pt-stated)      Walk more for exercise. Portion control meals. Stay hydrated.        This is a list of the screening recommended for you and due dates:  Health Maintenance  Topic Date Due   Zoster (Shingles) Vaccine (1 of 2) Never done   Pneumonia Vaccine (2 of 2 - PPSV23 or PCV20) 08/23/2017   Colon Cancer Screening  06/15/2022   DTaP/Tdap/Td vaccine (2 - Td or Tdap) 07/27/2022   Flu Shot  10/19/2022   Mammogram  06/21/2023   Medicare Annual Wellness Visit  09/18/2023   DEXA scan (bone density measurement)  Completed   Hepatitis C Screening  Completed   HPV Vaccine  Aged Out   COVID-19 Vaccine  Discontinued    Advanced directives: Will work on this and when finished will bring a copy to the office  Conditions/risks identified: none  Next appointment: Follow up in one year for your annual wellness visit 09/19/23 @ 2:00   Preventive Care 65 Years and Older, Female Preventive care refers to lifestyle choices and visits with your health care provider that can promote health and wellness. What does preventive care include? A yearly physical exam. This is also called an annual well check. Dental exams once or twice a year. Routine eye exams. Ask your health care provider how often you should have your eyes checked. Personal lifestyle choices, including: Daily care of your teeth and gums. Regular physical activity. Eating a healthy diet. Avoiding tobacco and drug use. Limiting alcohol use. Practicing safe sex. Taking low-dose aspirin every day. Taking vitamin and mineral  supplements as recommended by your health care provider. What happens during an annual well check? The services and screenings done by your health care provider during your annual well check will depend on your age, overall health, lifestyle risk factors, and family history of disease. Counseling  Your health care provider may ask you questions about your: Alcohol use. Tobacco use. Drug use. Emotional well-being. Home and relationship well-being. Sexual activity. Eating habits. History of falls. Memory and ability to understand (cognition). Work and work Astronomer. Reproductive health. Screening  You may have the following tests or measurements: Height, weight, and BMI. Blood pressure. Lipid and cholesterol levels. These may be checked every 5 years, or more frequently if you are over 52 years old. Skin check. Lung cancer screening. You may have this screening every year starting at age 33 if you have a 30-pack-year history of smoking and currently smoke or have quit within the past 15 years. Fecal occult blood test (FOBT) of the stool. You may have this test every year starting at age 75. Flexible sigmoidoscopy or colonoscopy. You may have a sigmoidoscopy every 5 years or a colonoscopy every 10 years starting at age 59. Hepatitis C blood test. Hepatitis B blood test. Sexually transmitted disease (STD) testing. Diabetes screening. This is done by checking your blood sugar (glucose) after you have not eaten for a while (fasting). You may have  this done every 1-3 years. Bone density scan. This is done to screen for osteoporosis. You may have this done starting at age 104. Mammogram. This may be done every 1-2 years. Talk to your health care provider about how often you should have regular mammograms. Talk with your health care provider about your test results, treatment options, and if necessary, the need for more tests. Vaccines  Your health care provider may recommend certain  vaccines, such as: Influenza vaccine. This is recommended every year. Tetanus, diphtheria, and acellular pertussis (Tdap, Td) vaccine. You may need a Td booster every 10 years. Zoster vaccine. You may need this after age 72. Pneumococcal 13-valent conjugate (PCV13) vaccine. One dose is recommended after age 73. Pneumococcal polysaccharide (PPSV23) vaccine. One dose is recommended after age 68. Talk to your health care provider about which screenings and vaccines you need and how often you need them. This information is not intended to replace advice given to you by your health care provider. Make sure you discuss any questions you have with your health care provider. Document Released: 04/02/2015 Document Revised: 11/24/2015 Document Reviewed: 01/05/2015 Elsevier Interactive Patient Education  2017 [place] Prevention in the Home Falls can cause injuries. They can happen to people of all ages. There are many things you can do to make your home safe and to help prevent falls. What can I do on the outside of my home? Regularly fix the edges of walkways and driveways and fix any cracks. Remove anything that might make you trip as you walk through a door, such as a raised step or threshold. Trim any bushes or trees on the path to your home. Use bright outdoor lighting. Clear any walking paths of anything that might make someone trip, such as rocks or tools. Regularly check to see if handrails are loose or broken. Make sure that both sides of any steps have handrails. Any raised decks and porches should have guardrails on the edges. Have any leaves, snow, or ice cleared regularly. Use sand or salt on walking paths during winter. Clean up any spills in your garage right away. This includes oil or grease spills. What can I do in the bathroom? Use night lights. Install grab bars by the toilet and in the tub and shower. Do not use towel bars as grab bars. Use non-skid mats or decals in  the tub or shower. If you need to sit down in the shower, use a plastic, non-slip stool. Keep the floor dry. Clean up any water that spills on the floor as soon as it happens. Remove soap buildup in the tub or shower regularly. Attach bath mats securely with double-sided non-slip rug tape. Do not have throw rugs and other things on the floor that can make you trip. What can I do in the bedroom? Use night lights. Make sure that you have a light by your bed that is easy to reach. Do not use any sheets or blankets that are too big for your bed. They should not hang down onto the floor. Have a firm chair that has side arms. You can use this for support while you get dressed. Do not have throw rugs and other things on the floor that can make you trip. What can I do in the kitchen? Clean up any spills right away. Avoid walking on wet floors. Keep items that you use a lot in easy-to-reach places. If you need to reach something above you, use a strong step stool  that has a grab bar. Keep electrical cords out of the way. Do not use floor polish or wax that makes floors slippery. If you must use wax, use non-skid floor wax. Do not have throw rugs and other things on the floor that can make you trip. What can I do with my stairs? Do not leave any items on the stairs. Make sure that there are handrails on both sides of the stairs and use them. Fix handrails that are broken or loose. Make sure that handrails are as long as the stairways. Check any carpeting to make sure that it is firmly attached to the stairs. Fix any carpet that is loose or worn. Avoid having throw rugs at the top or bottom of the stairs. If you do have throw rugs, attach them to the floor with carpet tape. Make sure that you have a light switch at the top of the stairs and the bottom of the stairs. If you do not have them, ask someone to add them for you. What else can I do to help prevent falls? Wear shoes that: Do not have high  heels. Have rubber bottoms. Are comfortable and fit you well. Are closed at the toe. Do not wear sandals. If you use a stepladder: Make sure that it is fully opened. Do not climb a closed stepladder. Make sure that both sides of the stepladder are locked into place. Ask someone to hold it for you, if possible. Clearly mark and make sure that you can see: Any grab bars or handrails. First and last steps. Where the edge of each step is. Use tools that help you move around (mobility aids) if they are needed. These include: Canes. Walkers. Scooters. Crutches. Turn on the lights when you go into a dark area. Replace any light bulbs as soon as they burn out. Set up your furniture so you have a clear path. Avoid moving your furniture around. If any of your floors are uneven, fix them. If there are any pets around you, be aware of where they are. Review your medicines with your doctor. Some medicines can make you feel dizzy. This can increase your chance of falling. Ask your doctor what other things that you can do to help prevent falls. This information is not intended to replace advice given to you by your health care provider. Make sure you discuss any questions you have with your health care provider. Document Released: 12/31/2008 Document Revised: 08/12/2015 Document Reviewed: 04/10/2014 Elsevier Interactive Patient Education  2017 [employer].

## 2022-09-18 NOTE — Progress Notes (Addendum)
Subjective:   Donna Richards is a 72 y.o. female who presents for Medicare Annual (Subsequent) preventive examination.  Visit Complete: Virtual  I connected with  [name] on 09/18/22 by a audio enabled telemedicine application and verified that I am speaking with the correct person using two identifiers.  Patient Location: Home  Provider Location: Office/Clinic  I discussed the limitations of evaluation and management by telemedicine. The patient expressed understanding and agreed to proceed.  Patient Medicare AWV questionnaire was completed by the patient on 09/14/22; I have confirmed that all information answered by patient is correct and no changes since this date.  Review of Systems     Cardiac Risk Factors include: advanced age (>23men, >11 women);obesity (BMI >30kg/m2);hypertension;dyslipidemia     Objective:    Today's Vitals   09/18/22 1412  Weight: 195 lb (88.5 kg)  Height: 5' 4.6" (1.641 m)   Body mass index is 32.85 kg/m.     09/18/2022    2:32 PM 08/11/2021    2:55 PM 08/05/2020    3:03 PM 08/13/2019    3:45 PM  Advanced Directives  Does Patient Have a Medical Advance Directive? No No No No  Would patient like information on creating a medical advance directive?  No - Patient declined No - Patient declined Yes (MAU/Ambulatory/Procedural Areas - Information given)    Current Medications (verified) Outpatient Encounter Medications as of 09/18/2022  Medication Sig   albuterol (PROVENTIL HFA;VENTOLIN HFA) 108 (90 Base) MCG/ACT inhaler Inhale into the lungs.   aspirin EC 81 MG tablet Take 81 mg by mouth every other day.   cholecalciferol (VITAMIN D3) 25 MCG (1000 UNIT) tablet Take 1,000 Units by mouth daily.   fluticasone (FLONASE) 50 MCG/ACT nasal spray instill 1 spray into each nostril twice a day   hydrochlorothiazide (HYDRODIURIL) 25 MG tablet TAKE 1 TABLET(25 MG) BY MOUTH DAILY   pantoprazole (PROTONIX) 40 MG tablet TAKE 1 TABLET(40 MG) BY MOUTH DAILY    PULMICORT FLEXHALER 180 MCG/ACT inhaler Inhale 180 mcg into the lungs as needed.   RA ALLERGY RELIEF 180 MG tablet take 1 tablet by mouth once daily   rosuvastatin (CRESTOR) 5 MG tablet TAKE 1 TABLET(5 MG) BY MOUTH DAILY   No facility-administered encounter medications on file as of 09/18/2022.    Allergies (verified) Egg-derived products and Shellfish allergy   History: Past Medical History:  Diagnosis Date   Allergy    Arthritis    Asthma    Asthma, chronic 06/09/2014   Apparently diagnosed by Dr . Adonis Huguenin at [facility] many years ago with no pulmonology follow up sinc ethen.  Using flovent,  Which is his no longer covered by insurance.     GERD (gastroesophageal reflux disease)    Hyperlipidemia    Inflammatory polyps of colon (HCC)    Migraines    Stroke (HCC) 1992   UTI (urinary tract infection)    Past Surgical History:  Procedure Laterality Date   ABDOMINAL HYSTERECTOMY  1996   endometriosis   BILATERAL OOPHORECTOMY     endometriosis   Family History  Problem Relation Age of Onset   Hyperlipidemia Mother    Heart disease Mother    Kidney disease Mother    Diabetes Mother    Heart disease Father    COPD Father    Diabetes Maternal Grandmother    Heart disease Maternal Grandfather    Stroke Paternal Grandmother    Social History   Socioeconomic History   Marital status:  Divorced    Spouse name: Not on file   Number of children: Not on file   Years of education: Not on file   Highest education level: Not on file  Occupational History   Not on file  Tobacco Use   Smoking status: Never   Smokeless tobacco: Never  Substance and Sexual Activity   Alcohol use: No   Drug use: No   Sexual activity: Yes  Other Topics Concern   Not on file  Social History Narrative   Not on file   Social Determinants of Health   Financial Resource Strain: Low Risk  (09/18/2022)   Overall Financial Resource Strain (CARDIA)    Difficulty of Paying Living Expenses: Not  hard at all  Food Insecurity: No Food Insecurity (09/18/2022)   Hunger Vital Sign    Worried About Running Out of Food in the Last Year: Never true    Ran Out of Food in the Last Year: Never true  Transportation Needs: No Transportation Needs (09/18/2022)   PRAPARE - Administrator, Civil Service (Medical): No    Lack of Transportation (Non-Medical): No  Physical Activity: Inactive (09/18/2022)   Exercise Vital Sign    Days of Exercise per Week: 0 days    Minutes of Exercise per Session: 0 min  Stress: No Stress Concern Present (09/18/2022)   [employer] of Occupational Health - Occupational Stress Questionnaire    Feeling of Stress : Not at all  Social Connections: Moderately Integrated (09/18/2022)   Social Connection and Isolation Panel [NHANES]    Frequency of Communication with Friends and Family: More than three times a week    Frequency of Social Gatherings with Friends and Family: More than three times a week    Attends Religious Services: More than 4 times per year    Active Member of [employer] or Organizations: Yes    Attends Engineer, structural: More than 4 times per year    Marital Status: Divorced    Tobacco Counseling Counseling given: Not Answered   Clinical Intake:  Pre-visit preparation completed: Yes  Pain : No/denies pain     BMI - recorded: 32.85 Nutritional Risks: None Diabetes: No  How often do you need to have someone help you when you read instructions, pamphlets, or other written materials from your doctor or pharmacy?: 1 - Never  Interpreter Needed?: No  Information entered by :: R. Yazmyne Sara LPN   Activities of Daily Living    09/18/2022    2:15 PM 09/18/2022    8:16 AM  In your present state of health, do you have any difficulty performing the following activities:  Hearing? 0 0  Vision? 0 0  Difficulty concentrating or making decisions? 0 0  Walking or climbing stairs? 0 0  Dressing or bathing? 0 0  Doing errands, shopping?  0 0  Preparing Food and eating ? N N  Using the Toilet? N N  In the past six months, have you accidently leaked urine? N N  Do you have problems with loss of bowel control? N N  Managing your Medications? N N  Managing your Finances? N N  Housekeeping or managing your Housekeeping? N N    Patient Care Team: Sherlene Shams, MD as PCP - General (Internal Medicine) Mertie Moores, MD as Referring Physician (Specialist)  Indicate any recent Medical Services you may have received from other than [facility] providers in the past year (date may be approximate).  Assessment:   This is a routine wellness examination for Lashonna.  Hearing/Vision screen Hearing Screening - Comments:: No issues Vision Screening - Comments:: Glasses, no issures  Dietary issues and exercise activities discussed:     Goals Addressed             This Visit's Progress    Patient Stated       Watch what she eats, and continue to lose weight, to start an exercise program      Depression Screen    09/18/2022    2:29 PM 09/18/2022    2:28 PM 09/18/2022    2:27 PM 12/06/2021    1:35 PM 08/11/2021    2:53 PM 12/03/2020    8:15 AM 08/05/2020    2:59 PM  PHQ 2/9 Scores  PHQ - 2 Score 0 0 0 0 0 0 0  PHQ- 9 Score 0 0 0      Exception Documentation  Patient refusal         Fall Risk    09/18/2022    2:36 PM 09/18/2022    8:16 AM 09/14/2022    1:20 PM 12/06/2021    1:35 PM 08/11/2021    2:57 PM  Fall Risk   Falls in the past year? 0 0 0 0 0  Number falls in past yr: 0   0 0  Injury with Fall? 0   0   Risk for fall due to : No Fall Risks   No Fall Risks   Follow up Falls prevention discussed;Falls evaluation completed;Education provided   Falls evaluation completed Falls evaluation completed    MEDICARE RISK AT HOME:  Medicare Risk at Home - 09/18/22 1436     Any stairs in or around the home? Yes    If so, are there any without handrails? Yes    Home free of loose throw rugs in walkways, pet beds,  electrical cords, etc? Yes    Adequate lighting in your home to reduce risk of falls? Yes    Life alert? No    Use of a cane, walker or w/c? No    Grab bars in the bathroom? No    Shower chair or bench in shower? No              Cognitive Function:        09/18/2022    2:33 PM  6CIT Screen  What Year? 0 points  What month? 0 points  What time? 0 points  Count back from 20 0 points  Months in reverse 0 points  Repeat phrase 2 points  Total Score 2 points    Immunizations Immunization History  Administered Date(s) Administered   Influenza Inj Mdck Quad Pf 01/13/2019   Influenza, Quadrivalent, Recombinant, Inj, Pf 01/02/2017, 01/25/2018   Pneumococcal Conjugate-13 08/23/2016   Tdap 07/26/2012   Zoster, Live 07/28/2013    TDAP status: Due, Education has been provided regarding the importance of this vaccine. Advised may receive this vaccine at local pharmacy or Health Dept. Aware to provide a copy of the vaccination record if obtained from local pharmacy or Health Dept. Verbalized acceptance and understanding.  Flu Vaccine status: Due, Education has been provided regarding the importance of this vaccine. Advised may receive this vaccine at local pharmacy or Health Dept. Aware to provide a copy of the vaccination record if obtained from local pharmacy or Health Dept. Verbalized acceptance and understanding.  Pneumococcal vaccine status: Due, Education has been provided regarding the importance of  this vaccine. Advised may receive this vaccine at local pharmacy or Health Dept. Aware to provide a copy of the vaccination record if obtained from local pharmacy or Health Dept. Verbalized acceptance and understanding.  Covid-19 vaccine status: Declined, Education has been provided regarding the importance of this vaccine but patient still declined. Advised may receive this vaccine at local pharmacy or Health Dept.or vaccine clinic. Aware to provide a copy of the vaccination record if  obtained from local pharmacy or Health Dept. Verbalized acceptance and understanding.  Qualifies for Shingles Vaccine? Yes   Zostavax completed Yes  5/15 Shingrix Completed?: No.    Education has been provided regarding the importance of this vaccine. Patient has been advised to call insurance company to determine out of pocket expense if they have not yet received this vaccine. Advised may also receive vaccine at local pharmacy or Health Dept. Verbalized acceptance and understanding.  Screening Tests Health Maintenance  Topic Date Due   Zoster Vaccines- Shingrix (1 of 2) Never done   Pneumonia Vaccine 4+ Years old (2 of 2 - PPSV23 or PCV20) 08/23/2017   Colonoscopy  06/15/2022   DTaP/Tdap/Td (2 - Td or Tdap) 07/27/2022   INFLUENZA VACCINE  10/19/2022   MAMMOGRAM  06/21/2023   Medicare Annual Wellness (AWV)  09/18/2023   DEXA SCAN  Completed   Hepatitis C Screening  Completed   HPV VACCINES  Aged Out   COVID-19 Vaccine  Discontinued    Health Maintenance  Health Maintenance Due  Topic Date Due   Zoster Vaccines- Shingrix (1 of 2) Never done   Pneumonia Vaccine 41+ Years old (2 of 2 - PPSV23 or PCV20) 08/23/2017   Colonoscopy  06/15/2022   DTaP/Tdap/Td (2 - Td or Tdap) 07/27/2022    Colorectal cancer screening: Type of screening: Colonoscopy. Completed 3/19. Repeat every 5 years Has an appointment 9/24 with her GI doctor  Mammogram status: Completed 06/21/22. Repeat every year  Patient had a Dexa Scan 12/2010, patient unsure what the diagnosis was. Patient wants to discuss with her PCP before having another one.  Lung Cancer Screening: (Low Dose CT Chest recommended if Age 53-80 years, 20 pack-year currently smoking OR have quit w/in 15years.) does not qualify.     Additional Screening:  Hepatitis C Screening: does qualify; Completed 6/17  Vision Screening: Recommended annual ophthalmology exams for early detection of glaucoma and other disorders of the eye. Is the  patient up to date with their annual eye exam?  Yes  Who is the provider or what is the name of the office in which the patient attends annual eye exams? [facility] If pt is not established with a provider, would they like to be referred to a provider to establish care? No .   Dental Screening: Recommended annual dental exams for proper oral hygiene    Community Resource Referral / Chronic Care Management: CRR required this visit?  No   CCM required this visit?  No     Plan:     I have personally reviewed and noted the following in the patient's chart:   Medical and social history Use of alcohol, tobacco or illicit drugs  Current medications and supplements including opioid prescriptions. Patient is not currently taking opioid prescriptions. Functional ability and status Nutritional status Physical activity Advanced directives List of other physicians Hospitalizations, surgeries, and ER visits in previous 12 months Vitals Screenings to include cognitive, depression, and falls Referrals and appointments  In addition, I have reviewed and discussed with  patient certain preventive protocols, quality metrics, and best practice recommendations. A written personalized care plan for preventive services as well as general preventive health recommendations were provided to patient.     Sydell Axon, LPN   03/25/1094   After Visit Summary: (MyChart) Due to this being a telephonic visit, the after visit summary with patients personalized plan was offered to patient via MyChart   Nurse Notes: None    I have reviewed the above information and agree with above.   Duncan Dull, MD

## 2022-09-22 DIAGNOSIS — G4733 Obstructive sleep apnea (adult) (pediatric): Secondary | ICD-10-CM | POA: Diagnosis not present

## 2022-09-28 DIAGNOSIS — G4733 Obstructive sleep apnea (adult) (pediatric): Secondary | ICD-10-CM | POA: Diagnosis not present

## 2022-10-12 ENCOUNTER — Ambulatory Visit (INDEPENDENT_AMBULATORY_CARE_PROVIDER_SITE_OTHER): Payer: Medicare HMO

## 2022-10-12 DIAGNOSIS — E538 Deficiency of other specified B group vitamins: Secondary | ICD-10-CM | POA: Diagnosis not present

## 2022-10-12 MED ORDER — CYANOCOBALAMIN 1000 MCG/ML IJ SOLN
1000.0000 ug | Freq: Once | INTRAMUSCULAR | Status: AC
  Administered 2022-10-12: 1000 ug via INTRAMUSCULAR

## 2022-10-12 NOTE — Progress Notes (Signed)
Patient arrived for a B12 injection and it was administered into her left deltoid. Patient tolerated the injection well and did not show any signs of distress or voice any concerns. 

## 2022-10-18 ENCOUNTER — Encounter (INDEPENDENT_AMBULATORY_CARE_PROVIDER_SITE_OTHER): Payer: Self-pay

## 2022-10-19 DIAGNOSIS — J4520 Mild intermittent asthma, uncomplicated: Secondary | ICD-10-CM | POA: Diagnosis not present

## 2022-10-19 DIAGNOSIS — J301 Allergic rhinitis due to pollen: Secondary | ICD-10-CM | POA: Diagnosis not present

## 2022-10-19 DIAGNOSIS — G4733 Obstructive sleep apnea (adult) (pediatric): Secondary | ICD-10-CM | POA: Diagnosis not present

## 2022-10-21 ENCOUNTER — Other Ambulatory Visit: Payer: Self-pay | Admitting: Internal Medicine

## 2022-10-23 ENCOUNTER — Other Ambulatory Visit: Payer: Self-pay

## 2022-10-23 DIAGNOSIS — G4733 Obstructive sleep apnea (adult) (pediatric): Secondary | ICD-10-CM | POA: Diagnosis not present

## 2022-10-23 MED ORDER — PANTOPRAZOLE SODIUM 40 MG PO TBEC: DELAYED_RELEASE_TABLET | ORAL | 1 refills | Status: DC

## 2022-10-25 ENCOUNTER — Other Ambulatory Visit: Payer: Self-pay | Admitting: Internal Medicine

## 2022-10-25 ENCOUNTER — Telehealth: Payer: Self-pay | Admitting: Internal Medicine

## 2022-10-25 NOTE — Telephone Encounter (Signed)
Patient just called and wants to get her medication refill for hydrochlorothiazide (HYDRODIURIL) 25 MG tablet. She states she only has 2 more pills left. She also tried to get a refill through her Mychart. Her pharmacy is [id number] - [place], [place] - [address] AT [facility] OF [address].

## 2022-10-25 NOTE — Telephone Encounter (Signed)
Called pharmacy and they stated that they did not reecive the order that was faxed over yesterday. I gave a verbal order. Pharmacist stated that he will have the rx ready for pt in the morning. Called pt back to let her know. She gave a verbal understanding.

## 2022-11-02 ENCOUNTER — Encounter (INDEPENDENT_AMBULATORY_CARE_PROVIDER_SITE_OTHER): Payer: Self-pay

## 2022-11-13 ENCOUNTER — Ambulatory Visit (INDEPENDENT_AMBULATORY_CARE_PROVIDER_SITE_OTHER): Payer: Medicare HMO

## 2022-11-13 DIAGNOSIS — E538 Deficiency of other specified B group vitamins: Secondary | ICD-10-CM | POA: Diagnosis not present

## 2022-11-13 MED ORDER — CYANOCOBALAMIN 1000 MCG/ML IJ SOLN
1000.0000 ug | Freq: Once | INTRAMUSCULAR | Status: AC
Start: 2022-11-13 — End: 2022-11-13
  Administered 2022-11-13: 1000 ug via INTRAMUSCULAR

## 2022-11-13 NOTE — Progress Notes (Signed)
Pt presented for their vitamin B12 injection. Pt was identified through two identifiers. Pt tolerated shot well in their right deltoid.  

## 2022-11-22 DIAGNOSIS — K5790 Diverticulosis of intestine, part unspecified, without perforation or abscess without bleeding: Secondary | ICD-10-CM | POA: Diagnosis not present

## 2022-11-22 DIAGNOSIS — Z86010 Personal history of colonic polyps: Secondary | ICD-10-CM | POA: Diagnosis not present

## 2022-11-22 DIAGNOSIS — K219 Gastro-esophageal reflux disease without esophagitis: Secondary | ICD-10-CM | POA: Diagnosis not present

## 2022-11-23 DIAGNOSIS — G4733 Obstructive sleep apnea (adult) (pediatric): Secondary | ICD-10-CM | POA: Diagnosis not present

## 2022-12-07 ENCOUNTER — Encounter: Payer: Medicare HMO | Admitting: Internal Medicine

## 2022-12-11 ENCOUNTER — Ambulatory Visit (INDEPENDENT_AMBULATORY_CARE_PROVIDER_SITE_OTHER): Payer: Medicare HMO | Admitting: Internal Medicine

## 2022-12-11 ENCOUNTER — Encounter: Payer: Self-pay | Admitting: Internal Medicine

## 2022-12-11 VITALS — BP 124/78 | HR 74 | Temp 97.4°F | Ht 64.5 in | Wt 194.4 lb

## 2022-12-11 DIAGNOSIS — R7303 Prediabetes: Secondary | ICD-10-CM | POA: Diagnosis not present

## 2022-12-11 DIAGNOSIS — E559 Vitamin D deficiency, unspecified: Secondary | ICD-10-CM | POA: Diagnosis not present

## 2022-12-11 DIAGNOSIS — Z23 Encounter for immunization: Secondary | ICD-10-CM | POA: Diagnosis not present

## 2022-12-11 DIAGNOSIS — J4520 Mild intermittent asthma, uncomplicated: Secondary | ICD-10-CM

## 2022-12-11 DIAGNOSIS — D126 Benign neoplasm of colon, unspecified: Secondary | ICD-10-CM

## 2022-12-11 DIAGNOSIS — E785 Hyperlipidemia, unspecified: Secondary | ICD-10-CM

## 2022-12-11 DIAGNOSIS — E669 Obesity, unspecified: Secondary | ICD-10-CM

## 2022-12-11 DIAGNOSIS — Z0000 Encounter for general adult medical examination without abnormal findings: Secondary | ICD-10-CM | POA: Diagnosis not present

## 2022-12-11 DIAGNOSIS — I10 Essential (primary) hypertension: Secondary | ICD-10-CM

## 2022-12-11 DIAGNOSIS — Z780 Asymptomatic menopausal state: Secondary | ICD-10-CM | POA: Diagnosis not present

## 2022-12-11 DIAGNOSIS — E538 Deficiency of other specified B group vitamins: Secondary | ICD-10-CM

## 2022-12-11 LAB — COMPREHENSIVE METABOLIC PANEL
ALT: 13 U/L (ref 0–35)
AST: 17 U/L (ref 0–37)
Albumin: 4.2 g/dL (ref 3.5–5.2)
Alkaline Phosphatase: 34 U/L — ABNORMAL LOW (ref 39–117)
BUN: 15 mg/dL (ref 6–23)
CO2: 33 mEq/L — ABNORMAL HIGH (ref 19–32)
Calcium: 9.8 mg/dL (ref 8.4–10.5)
Chloride: 100 mEq/L (ref 96–112)
Creatinine, Ser: 0.96 mg/dL (ref 0.40–1.20)
GFR: 59.6 mL/min — ABNORMAL LOW (ref >60.00)
Glucose, Bld: 85 mg/dL (ref 70–99)
Potassium: 3.5 mEq/L (ref 3.5–5.1)
Sodium: 141 mEq/L (ref 135–145)
Total Bilirubin: 1.2 mg/dL (ref 0.2–1.2)
Total Protein: 7 g/dL (ref 6.0–8.3)

## 2022-12-11 LAB — LIPID PANEL
Cholesterol: 164 mg/dL (ref 0–200)
HDL: 75.5 mg/dL (ref >39.00)
LDL Cholesterol: 72 mg/dL (ref 0–99)
NonHDL: 88.26
Total CHOL/HDL Ratio: 2
Triglycerides: 82 mg/dL (ref 0.0–149.0)
VLDL: 16.4 mg/dL (ref 0.0–40.0)

## 2022-12-11 LAB — CBC WITH DIFFERENTIAL/PLATELET
Basophils Absolute: 0 10*3/uL (ref 0.0–0.1)
Basophils Relative: 1.1 % (ref 0.0–3.0)
Eosinophils Absolute: 0.1 10*3/uL (ref 0.0–0.7)
Eosinophils Relative: 2 % (ref 0.0–5.0)
HCT: 42.6 % (ref 36.0–46.0)
Hemoglobin: 13.7 g/dL (ref 12.0–15.0)
Lymphocytes Relative: 26.5 % (ref 12.0–46.0)
Lymphs Abs: 1 10*3/uL (ref 0.7–4.0)
MCHC: 32.1 g/dL (ref 30.0–36.0)
MCV: 89.6 fl (ref 78.0–100.0)
Monocytes Absolute: 0.5 10*3/uL (ref 0.1–1.0)
Monocytes Relative: 12.6 % — ABNORMAL HIGH (ref 3.0–12.0)
Neutro Abs: 2.2 10*3/uL (ref 1.4–7.7)
Neutrophils Relative %: 57.8 % (ref 43.0–77.0)
Platelets: 204 10*3/uL (ref 150.0–400.0)
RBC: 4.76 Mil/uL (ref 3.87–5.11)
RDW: 13.7 % (ref 11.5–15.5)
WBC: 3.8 10*3/uL — ABNORMAL LOW (ref 4.0–10.5)

## 2022-12-11 LAB — LDL CHOLESTEROL, DIRECT: Direct LDL: 78 mg/dL

## 2022-12-11 LAB — MICROALBUMIN / CREATININE URINE RATIO
Creatinine,U: 38.4 mg/dL
Microalb Creat Ratio: 1.8 mg/g (ref 0.0–30.0)
Microalb, Ur: <0.7 mg/dL (ref 0.0–1.9)

## 2022-12-11 LAB — TSH: TSH: 1.41 u[IU]/mL (ref 0.35–5.50)

## 2022-12-11 LAB — HEMOGLOBIN A1C: Hgb A1c MFr Bld: 5.8 % (ref 4.6–6.5)

## 2022-12-11 LAB — VITAMIN D 25 HYDROXY (VIT D DEFICIENCY, FRACTURES): VITD: 91.53 ng/mL (ref 30.00–100.00)

## 2022-12-11 MED ORDER — CYANOCOBALAMIN 1000 MCG/ML IJ SOLN
1000.0000 ug | Freq: Once | INTRAMUSCULAR | Status: AC
Start: 2022-12-11 — End: 2022-12-11
  Administered 2022-12-11: 1000 ug via INTRAMUSCULAR

## 2022-12-11 MED ORDER — VITAMIN D 25 MCG (1000 UNIT) PO TABS: 5000.0000 [IU] | ORAL_TABLET | Freq: Every day | ORAL | Status: AC

## 2022-12-11 NOTE — Assessment & Plan Note (Addendum)
Well controlled on current regimen of hctz  Renal function  is  borderline abnormal.   Lab Results  Component Value Date   CREATININE 0.96 12/11/2022   . Lab Results  Component Value Date   NA 141 12/11/2022   K 3.5 12/11/2022   CL 100 12/11/2022   CO2 33 (H) 12/11/2022

## 2022-12-11 NOTE — Assessment & Plan Note (Signed)
She prefers to Continue monthly IM injections in office

## 2022-12-11 NOTE — Assessment & Plan Note (Signed)
Lipids have been at goal on every other day dosing of crestor.  LFTS are normal Lab Results  Component Value Date   CHOL 164 12/11/2022   HDL 75.50 12/11/2022   LDLCALC 72 12/11/2022   LDLDIRECT 78.0 12/11/2022   TRIG 82.0 12/11/2022   CHOLHDL 2 12/11/2022   Lab Results  Component Value Date   ALT 13 12/11/2022   AST 17 12/11/2022   ALKPHOS 34 (L) 12/11/2022   BILITOT 1.2 12/11/2022

## 2022-12-11 NOTE — Assessment & Plan Note (Signed)
She has been taking 5000 IUs daily.  Level needed.

## 2022-12-11 NOTE — Progress Notes (Signed)
Patient ID: Donna Richards, female    DOB: 01-15-1951  Age: 73 y.o. MRN: [id number]  The patient is here for annual preventive examination and management of other chronic and acute problems.   The risk factors are reflected in the social history.   The roster of all physicians providing medical care to patient - is listed in the Snapshot section of the chart.   Activities of daily living:  The patient is 100% independent in all ADLs: dressing, toileting, feeding as well as independent mobility   Home safety : The patient has smoke detectors in the home. They wear seatbelts.  There are no unsecured firearms at home. There is no violence in the home.    There is no risks for hepatitis, STDs or HIV. There is no   history of blood transfusion. They have no travel history to infectious disease endemic areas of the world.   The patient has seen their dentist in the last six month. They have seen their eye doctor in the last year. The patinet  denies slight hearing difficulty with regard to whispered voices and some television programs.  They have deferred audiologic testing in the last year.  They do not  have excessive sun exposure. Discussed the need for sun protection: hats, long sleeves and use of sunscreen if there is significant sun exposure.    Diet: the importance of a healthy diet is discussed. They do have a healthy diet.   The benefits of regular aerobic exercise were discussed. The patient  exercises  3 to 5 days per week  for  60 minutes.    Depression screen: there are no signs or vegative symptoms of depression- irritability, change in appetite, anhedonia, sadness/tearfullness.   The following portions of the patient's history were reviewed and updated as appropriate: allergies, current medications, past family history, past medical history,  past surgical history, past social history  and problem list.   Visual acuity was not assessed per patient preference since the patient has  regular follow up with an  ophthalmologist. Hearing and body mass index were assessed and reviewed.    During the course of the visit the patient was educated and counseled about appropriate screening and preventive services including : fall prevention , diabetes screening, nutrition counseling, colorectal cancer screening, and recommended immunizations.    Chief Complaint:    None  Review of Symptoms  Patient denies headache, fevers, malaise, unintentional weight loss, skin rash, eye pain, sinus congestion and sinus pain, sore throat, dysphagia,  hemoptysis , cough, dyspnea, wheezing, chest pain, palpitations, orthopnea, edema, abdominal pain, nausea, melena, diarrhea, constipation, flank pain, dysuria, hematuria, urinary  Frequency, nocturia, numbness, tingling, seizures,  Focal weakness, Loss of consciousness,  Tremor, insomnia, depression, anxiety, and suicidal ideation.    Physical Exam:  BP 124/78   Pulse 74   Temp (!) 97.4 F (36.3 C) (Oral)   Ht 5' 4.5" (1.638 m)   Wt 194 lb 6.4 oz (88.2 kg)   SpO2 94%   BMI 32.85 kg/m    Physical Exam Vitals reviewed.  Constitutional:      General: She is not in acute distress.    Appearance: Normal appearance. She is well-developed and normal weight. She is not ill-appearing, toxic-appearing or diaphoretic.  HENT:     Head: Normocephalic.     Right Ear: Tympanic membrane, ear canal and external ear normal. There is no impacted cerumen.     Left Ear: Tympanic membrane, ear canal and external ear  normal. There is no impacted cerumen.     Nose: Nose normal.     Mouth/Throat:     Mouth: Mucous membranes are moist.     Pharynx: Oropharynx is clear.  Eyes:     General: No scleral icterus.       Right eye: No discharge.        Left eye: No discharge.     Conjunctiva/sclera: Conjunctivae normal.     Pupils: Pupils are equal, round, and reactive to light.  Neck:     Thyroid: No thyromegaly.     Vascular: No carotid bruit or JVD.   Cardiovascular:     Rate and Rhythm: Normal rate and regular rhythm.     Heart sounds: Normal heart sounds.  Pulmonary:     Effort: Pulmonary effort is normal. No respiratory distress.     Breath sounds: Normal breath sounds.  Chest:  Breasts:    Breasts are symmetrical.     Right: Normal. No swelling, inverted nipple, mass, nipple discharge, skin change or tenderness.     Left: Normal. No swelling, inverted nipple, mass, nipple discharge, skin change or tenderness.  Abdominal:     General: Bowel sounds are normal.     Palpations: Abdomen is soft. There is no mass.     Tenderness: There is no abdominal tenderness. There is no guarding or rebound.  Musculoskeletal:        General: Normal range of motion.     Cervical back: Normal range of motion and neck supple.  Lymphadenopathy:     Cervical: No cervical adenopathy.     Upper Body:     Right upper body: No supraclavicular, axillary or pectoral adenopathy.     Left upper body: No supraclavicular, axillary or pectoral adenopathy.  Skin:    General: Skin is warm and dry.  Neurological:     General: No focal deficit present.     Mental Status: She is alert and oriented to person, place, and time. Mental status is at baseline.  Psychiatric:        Mood and Affect: Mood normal.        Behavior: Behavior normal.        Thought Content: Thought content normal.        Judgment: Judgment normal.     Assessment and Plan: Vitamin D deficiency Assessment & Plan: She has been taking 5000 IUs daily.  Level needed.   Orders: -     VITAMIN D 25 Hydroxy (Vit-D Deficiency, Fractures)  Primary hypertension Assessment & Plan: Well controlled on current regimen of hctz  Renal function  is  borderline abnormal.   Lab Results  Component Value Date   CREATININE 0.96 12/11/2022   . Lab Results  Component Value Date   NA 141 12/11/2022   K 3.5 12/11/2022   CL 100 12/11/2022   CO2 33 (H) 12/11/2022     Orders: -     Comprehensive  metabolic panel -     Microalbumin / creatinine urine ratio  Obesity (BMI 30-39.9) Assessment & Plan:  I have encouraged  Continued weight loss with goal of 10% of body weigh over the next 6 months using a low glycemic index diet and regular exercise a minimum of 5 days per week.    Orders: -     CBC with Differential/Platelet -     TSH  Hyperlipidemia with target LDL less than 100 Assessment & Plan: Lipids have been at goal on every other day  dosing of crestor.  LFTS are normal Lab Results  Component Value Date   CHOL 164 12/11/2022   HDL 75.50 12/11/2022   LDLCALC 72 12/11/2022   LDLDIRECT 78.0 12/11/2022   TRIG 82.0 12/11/2022   CHOLHDL 2 12/11/2022   Lab Results  Component Value Date   ALT 13 12/11/2022   AST 17 12/11/2022   ALKPHOS 34 (L) 12/11/2022   BILITOT 1.2 12/11/2022     Orders: -     Lipid panel -     LDL cholesterol, direct  Prediabetes -     Hemoglobin A1c -     Comprehensive metabolic panel -     Microalbumin / creatinine urine ratio  B12 deficiency Assessment & Plan: She prefers to Continue monthly IM injections in office   Orders: -     Cyanocobalamin  Postmenopausal estrogen deficiency -     DG Bone Density; Future  Adenomatous polyp of colon, unspecified part of colon Assessment & Plan: Colonoscopy is scheduled for November 8 by [place]   Visit for preventive health examination Assessment & Plan: age appropriate education and counseling updated, referrals for preventative services and immunizations addressed, dietary and smoking counseling addressed, most recent labs reviewed.  I have personally reviewed and have noted:   1) the patient's medical and social history 2) The pt's use of alcohol, tobacco, and illicit drugs 3) The patient's current medications and supplements 4) Functional ability including ADL's, fall risk, home safety risk, hearing and visual impairment 5) Diet and physical activities 6) Evidence for depression or mood  disorder 7) The patient's height, weight, and BMI have been recorded in the chart 8) I have ordered and reviewed a 12 lead EKG and find that there are no acute changes and patient is in sinus rhythm.     I have made referrals, and provided counseling and education based on review of the above    Mild intermittent asthma without complication Assessment & Plan: No recent exacerbations.    Using arnuity Ellipta.  Pcv 20 given today    Encounter for immunization -     Pneumococcal conjugate vaccine 20-valent  Other orders -     Vitamin D; Take 5 tablets (5,000 Units total) by mouth daily.    No follow-ups on file.  Sherlene Shams, MD

## 2022-12-11 NOTE — Assessment & Plan Note (Signed)
Colonoscopy is scheduled for November 8 by [place]

## 2022-12-11 NOTE — Assessment & Plan Note (Addendum)
I have  encouraged  Continued weight loss with goal of 10% of body weigh over the next 6 months using a low glycemic index diet and regular exercise a minimum of 5 days per week.

## 2022-12-11 NOTE — Assessment & Plan Note (Signed)

## 2022-12-11 NOTE — Assessment & Plan Note (Signed)
No recent exacerbations.    Using arnuity Ellipta.  Pcv 20 given today

## 2022-12-11 NOTE — Patient Instructions (Signed)
Your bone density test  been ordered.  Please call to make your appointment at [facility]  [phone]    You received the pneumonia vaccine today. IT MAY MAKE YOU FEEL "UNDER  THE WEATHER " FOR 24 HOURS.     You are overdue for the TDAP VACCINE.  The Tdap (tetanus-diphtheria-whooping cough vaccine ) and Shingrix vaccines are now  100% COVERED BY MEDICARE if you get them at your pharmacy as of  January 1.   You can expect 24 hours of flu like symptoms after receiving the shingles vaccine, so plan accordingly.

## 2022-12-21 DIAGNOSIS — G4733 Obstructive sleep apnea (adult) (pediatric): Secondary | ICD-10-CM | POA: Diagnosis not present

## 2023-01-09 ENCOUNTER — Ambulatory Visit: Payer: Medicare HMO

## 2023-01-09 DIAGNOSIS — E538 Deficiency of other specified B group vitamins: Secondary | ICD-10-CM | POA: Diagnosis not present

## 2023-01-09 DIAGNOSIS — H524 Presbyopia: Secondary | ICD-10-CM | POA: Diagnosis not present

## 2023-01-09 MED ORDER — CYANOCOBALAMIN 1000 MCG/ML IJ SOLN
1000.0000 ug | Freq: Once | INTRAMUSCULAR | Status: AC
Start: 2023-01-09 — End: 2023-01-09
  Administered 2023-01-09: 1000 ug via INTRAMUSCULAR

## 2023-01-09 NOTE — Progress Notes (Signed)
Patient presented for B 12 injection to right deltoid, patient voiced no concerns nor showed any signs of distress during injection. 

## 2023-01-21 DIAGNOSIS — G4733 Obstructive sleep apnea (adult) (pediatric): Secondary | ICD-10-CM | POA: Diagnosis not present

## 2023-02-09 ENCOUNTER — Ambulatory Visit: Payer: Medicare HMO

## 2023-02-13 ENCOUNTER — Ambulatory Visit (INDEPENDENT_AMBULATORY_CARE_PROVIDER_SITE_OTHER): Payer: Medicare HMO

## 2023-02-13 DIAGNOSIS — E538 Deficiency of other specified B group vitamins: Secondary | ICD-10-CM

## 2023-02-13 MED ORDER — CYANOCOBALAMIN 1000 MCG/ML IJ SOLN
1000.0000 ug | Freq: Once | INTRAMUSCULAR | Status: AC
Start: 2023-02-13 — End: 2023-02-13
  Administered 2023-02-13: 1000 ug via INTRAMUSCULAR

## 2023-02-13 NOTE — Progress Notes (Signed)
Patient presented for B 12 injection to right deltoid, patient voiced no concerns nor showed any signs of distress during injection. 

## 2023-02-20 DIAGNOSIS — G4733 Obstructive sleep apnea (adult) (pediatric): Secondary | ICD-10-CM | POA: Diagnosis not present

## 2023-02-23 DIAGNOSIS — Z86007 Personal history of in-situ neoplasm of skin: Secondary | ICD-10-CM | POA: Diagnosis not present

## 2023-02-23 DIAGNOSIS — D2272 Melanocytic nevi of left lower limb, including hip: Secondary | ICD-10-CM | POA: Diagnosis not present

## 2023-02-23 DIAGNOSIS — D2262 Melanocytic nevi of left upper limb, including shoulder: Secondary | ICD-10-CM | POA: Diagnosis not present

## 2023-02-23 DIAGNOSIS — D225 Melanocytic nevi of trunk: Secondary | ICD-10-CM | POA: Diagnosis not present

## 2023-02-23 DIAGNOSIS — D2261 Melanocytic nevi of right upper limb, including shoulder: Secondary | ICD-10-CM | POA: Diagnosis not present

## 2023-02-23 DIAGNOSIS — L7211 Pilar cyst: Secondary | ICD-10-CM | POA: Diagnosis not present

## 2023-02-23 DIAGNOSIS — L821 Other seborrheic keratosis: Secondary | ICD-10-CM | POA: Diagnosis not present

## 2023-02-23 DIAGNOSIS — Z85828 Personal history of other malignant neoplasm of skin: Secondary | ICD-10-CM | POA: Diagnosis not present

## 2023-03-09 ENCOUNTER — Ambulatory Visit: Payer: Medicare HMO

## 2023-03-09 DIAGNOSIS — K5730 Diverticulosis of large intestine without perforation or abscess without bleeding: Secondary | ICD-10-CM

## 2023-03-09 DIAGNOSIS — D123 Benign neoplasm of transverse colon: Secondary | ICD-10-CM

## 2023-03-09 DIAGNOSIS — Z860100 Personal history of colon polyps, unspecified: Secondary | ICD-10-CM | POA: Diagnosis not present

## 2023-03-09 DIAGNOSIS — Z09 Encounter for follow-up examination after completed treatment for conditions other than malignant neoplasm: Secondary | ICD-10-CM

## 2023-03-09 DIAGNOSIS — Z860101 Personal history of adenomatous and serrated colon polyps: Secondary | ICD-10-CM

## 2023-03-09 DIAGNOSIS — K640 First degree hemorrhoids: Secondary | ICD-10-CM

## 2023-03-09 DIAGNOSIS — K6389 Other specified diseases of intestine: Secondary | ICD-10-CM | POA: Diagnosis not present

## 2023-03-09 DIAGNOSIS — K635 Polyp of colon: Secondary | ICD-10-CM | POA: Diagnosis not present

## 2023-03-16 ENCOUNTER — Ambulatory Visit (INDEPENDENT_AMBULATORY_CARE_PROVIDER_SITE_OTHER): Payer: Medicare HMO

## 2023-03-16 DIAGNOSIS — E538 Deficiency of other specified B group vitamins: Secondary | ICD-10-CM | POA: Diagnosis not present

## 2023-03-16 MED ORDER — CYANOCOBALAMIN 1000 MCG/ML IJ SOLN
1000.0000 ug | Freq: Once | INTRAMUSCULAR | Status: AC
Start: 2023-03-16 — End: 2023-03-16
  Administered 2023-03-16: 1000 ug via INTRAMUSCULAR

## 2023-03-16 NOTE — Progress Notes (Signed)
Patient presented for B 12 injection to left deltoid, patient voiced no concerns nor showed any signs of distress during injection. 

## 2023-04-09 DIAGNOSIS — G4733 Obstructive sleep apnea (adult) (pediatric): Secondary | ICD-10-CM | POA: Diagnosis not present

## 2023-04-13 ENCOUNTER — Ambulatory Visit: Payer: Medicare HMO

## 2023-04-17 ENCOUNTER — Ambulatory Visit (INDEPENDENT_AMBULATORY_CARE_PROVIDER_SITE_OTHER): Payer: Medicare Other

## 2023-04-17 DIAGNOSIS — E538 Deficiency of other specified B group vitamins: Secondary | ICD-10-CM | POA: Diagnosis not present

## 2023-04-17 MED ORDER — CYANOCOBALAMIN 1000 MCG/ML IJ SOLN
1000.0000 ug | Freq: Once | INTRAMUSCULAR | Status: AC
  Administered 2023-04-17: 1000 ug via INTRAMUSCULAR

## 2023-04-17 NOTE — Progress Notes (Signed)
Patient presented for B 12 injection to left deltoid, patient voiced no concerns nor showed any signs of distress during injection.

## 2023-04-19 ENCOUNTER — Other Ambulatory Visit: Payer: Self-pay | Admitting: Internal Medicine

## 2023-04-24 ENCOUNTER — Other Ambulatory Visit: Payer: Self-pay

## 2023-04-24 ENCOUNTER — Ambulatory Visit
Admission: RE | Admit: 2023-04-24 | Discharge: 2023-04-24 | Disposition: A | Discharge status: Home / Self Care | Payer: Medicare Other | Source: Ambulatory Visit | Attending: Internal Medicine | Admitting: Internal Medicine

## 2023-04-24 DIAGNOSIS — M85852 Other specified disorders of bone density and structure, left thigh: Secondary | ICD-10-CM | POA: Diagnosis not present

## 2023-04-24 DIAGNOSIS — Z780 Asymptomatic menopausal state: Secondary | ICD-10-CM | POA: Diagnosis not present

## 2023-04-24 MED ORDER — HYDROCHLOROTHIAZIDE 25 MG PO TABS: ORAL_TABLET | ORAL | 1 refills | Status: DC

## 2023-04-25 ENCOUNTER — Encounter: Payer: Self-pay | Admitting: Internal Medicine

## 2023-04-25 DIAGNOSIS — J4520 Mild intermittent asthma, uncomplicated: Secondary | ICD-10-CM | POA: Diagnosis not present

## 2023-04-25 DIAGNOSIS — G4733 Obstructive sleep apnea (adult) (pediatric): Secondary | ICD-10-CM | POA: Diagnosis not present

## 2023-05-18 ENCOUNTER — Ambulatory Visit: Payer: Medicare Other

## 2023-05-18 DIAGNOSIS — E538 Deficiency of other specified B group vitamins: Secondary | ICD-10-CM

## 2023-05-18 DIAGNOSIS — R5383 Other fatigue: Secondary | ICD-10-CM

## 2023-05-18 MED ORDER — CYANOCOBALAMIN 1000 MCG/ML IJ SOLN
1000.0000 ug | Freq: Once | INTRAMUSCULAR | Status: AC
  Administered 2023-05-18: 1000 ug via INTRAMUSCULAR

## 2023-05-18 NOTE — Progress Notes (Addendum)
 After obtaining consent, and per orders of Dr. Duncan Dull, injection of B-12 given IM in L deltoid by Malen Gauze, CMA. Patient tolerated injection well.

## 2023-06-15 ENCOUNTER — Ambulatory Visit: Payer: Medicare Other

## 2023-06-15 DIAGNOSIS — E538 Deficiency of other specified B group vitamins: Secondary | ICD-10-CM

## 2023-06-15 MED ORDER — CYANOCOBALAMIN 1000 MCG/ML IJ SOLN
1000.0000 ug | Freq: Once | INTRAMUSCULAR | Status: AC
  Administered 2023-06-15: 1000 ug via INTRAMUSCULAR

## 2023-06-15 NOTE — Progress Notes (Signed)
 Patient presented for B 12 injection to left deltoid, patient voiced no concerns nor showed any signs of distress during injection.

## 2023-06-22 DIAGNOSIS — Z1231 Encounter for screening mammogram for malignant neoplasm of breast: Secondary | ICD-10-CM | POA: Diagnosis not present

## 2023-06-22 LAB — HM MAMMOGRAPHY

## 2023-06-25 ENCOUNTER — Encounter: Payer: Self-pay | Admitting: Internal Medicine

## 2023-07-16 ENCOUNTER — Ambulatory Visit (INDEPENDENT_AMBULATORY_CARE_PROVIDER_SITE_OTHER)

## 2023-07-16 DIAGNOSIS — E538 Deficiency of other specified B group vitamins: Secondary | ICD-10-CM | POA: Diagnosis not present

## 2023-07-16 MED ORDER — CYANOCOBALAMIN 1000 MCG/ML IJ SOLN
1000.0000 ug | Freq: Once | INTRAMUSCULAR | Status: AC
  Administered 2023-07-16: 1000 ug via INTRAMUSCULAR

## 2023-07-16 NOTE — Progress Notes (Signed)
 Pt presented for their vitamin B12 injection. Pt was identified through two identifiers. Pt tolerated shot well in their right deltoid.

## 2023-08-15 ENCOUNTER — Ambulatory Visit (INDEPENDENT_AMBULATORY_CARE_PROVIDER_SITE_OTHER)

## 2023-08-15 DIAGNOSIS — E538 Deficiency of other specified B group vitamins: Secondary | ICD-10-CM | POA: Diagnosis not present

## 2023-08-15 MED ORDER — CYANOCOBALAMIN 1000 MCG/ML IJ SOLN
1000.0000 ug | Freq: Once | INTRAMUSCULAR | Status: AC
  Administered 2023-08-15: 1000 ug via INTRAMUSCULAR

## 2023-08-15 NOTE — Progress Notes (Signed)
 Pt presented for their vitamin B12 injection. Pt was identified through two identifiers. Pt tolerated shot well in their left deltoid.

## 2023-09-17 ENCOUNTER — Ambulatory Visit

## 2023-09-17 DIAGNOSIS — E538 Deficiency of other specified B group vitamins: Secondary | ICD-10-CM

## 2023-09-17 MED ORDER — CYANOCOBALAMIN 1000 MCG/ML IJ SOLN
1000.0000 ug | Freq: Once | INTRAMUSCULAR | Status: AC
  Administered 2023-09-17: 1000 ug via INTRAMUSCULAR

## 2023-09-17 NOTE — Progress Notes (Signed)
 Pt presented for their vitamin B12 injection. Pt was identified through two identifiers. Pt tolerated shot well in their right deltoid.

## 2023-09-19 ENCOUNTER — Ambulatory Visit (INDEPENDENT_AMBULATORY_CARE_PROVIDER_SITE_OTHER): Payer: Medicare HMO | Admitting: *Deleted

## 2023-09-19 VITALS — Ht 64.5 in | Wt 187.0 lb

## 2023-09-19 DIAGNOSIS — Z0000 Encounter for general adult medical examination without abnormal findings: Secondary | ICD-10-CM | POA: Diagnosis not present

## 2023-09-19 NOTE — Progress Notes (Signed)
 Subjective:   Donna Richards is a 74 y.o. who presents for a Medicare Wellness preventive visit.  As a reminder, Annual Wellness Visits don't include a physical exam, and some assessments may be limited, especially if this visit is performed virtually. We may recommend an in-person follow-up visit with your provider if needed.  Visit Complete: Virtual I connected with  Donna Richards on 09/19/23 by a audio enabled telemedicine application and verified that I am speaking with the correct person using two identifiers.  Patient Location: Other:  at work  Provider Location: Home Office  I discussed the limitations of evaluation and management by telemedicine. The patient expressed understanding and agreed to proceed.  Vital Signs: Because this visit was a virtual/telehealth visit, some criteria may be missing or patient reported. Any vitals not documented were not able to be obtained and vitals that have been documented are patient reported.  VideoDeclined- This patient declined Librarian, academic. Therefore the visit was completed with audio only.  Persons Participating in Visit: Patient.  AWV Questionnaire: Yes: Patient Medicare AWV questionnaire was completed by the patient on 09/17/23; I have confirmed that all information answered by patient is correct and no changes since this date.  Cardiac Risk Factors include: obesity (BMI >30kg/m2);hypertension;dyslipidemia     Objective:    Today's Vitals   09/19/23 1343  Weight: 187 lb (84.8 kg)  Height: 5' 4.5 (1.638 m)   Body mass index is 31.6 kg/m.     09/19/2023    1:55 PM 09/18/2022    2:32 PM 08/11/2021    2:55 PM 08/05/2020    3:03 PM 08/13/2019    3:45 PM  Advanced Directives  Does Patient Have a Medical Advance Directive? No No No No No  Would patient like information on creating a medical advance directive? No - Patient declined  No - Patient declined No - Patient declined Yes  (MAU/Ambulatory/Procedural Areas - Information given)    Current Medications (verified) Outpatient Encounter Medications as of 09/19/2023  Medication Sig   albuterol  (PROVENTIL  HFA;VENTOLIN  HFA) 108 (90 Base) MCG/ACT inhaler Inhale into the lungs.   ARNUITY ELLIPTA 100 MCG/ACT AEPB Inhale into the lungs.  Inhale 1 Puff (1 spray total) into the lungs once daily   aspirin EC 81 MG tablet Take 81 mg by mouth every other day.   cholecalciferol (VITAMIN D3) 25 MCG (1000 UNIT) tablet Take 5 tablets (5,000 Units total) by mouth daily.   fluticasone  (FLONASE ) 50 MCG/ACT nasal spray instill 1 spray into each nostril twice a day   hydrochlorothiazide  (HYDRODIURIL ) 25 MG tablet TAKE 1 TABLET(25 MG) BY MOUTH DAILY   pantoprazole  (PROTONIX ) 40 MG tablet TAKE 1 TABLET(40 MG) BY MOUTH DAILY   RA ALLERGY RELIEF 180 MG tablet take 1 tablet by mouth once daily   rosuvastatin  (CRESTOR ) 5 MG tablet TAKE 1 TABLET(5 MG) BY MOUTH DAILY   Na Sulfate-K Sulfate-Mg Sulf 17.5-3.13-1.6 GM/177ML SOLN Take by mouth. Take 1 Bottle by mouth as directed One kit contains 2 bottles. Take both bottles at the times instructed by your provider. (Patient not taking: Reported on 09/19/2023)   No facility-administered encounter medications on file as of 09/19/2023.    Allergies (verified) Egg-derived products and Shellfish allergy   History: Past Medical History:  Diagnosis Date   Allergy    Arthritis    Asthma    Asthma, chronic 06/09/2014   Apparently diagnosed by Dr . DiMeo at [facility] many years ago with no pulmonology  follow up sinc ethen.  Using flovent ,  Which is his no longer covered by insurance.     GERD (gastroesophageal reflux disease)    Hyperlipidemia    Inflammatory polyps of colon (HCC)    Migraines    Stroke (HCC) 1992   UTI (urinary tract infection)    Past Surgical History:  Procedure Laterality Date   ABDOMINAL HYSTERECTOMY  03/20/1994   endometriosis   BILATERAL OOPHORECTOMY     endometriosis    Family History  Problem Relation Age of Onset   Hyperlipidemia Mother    Heart disease Mother    Kidney disease Mother    Diabetes Mother    COPD Mother    Heart disease Father    COPD Father    Diabetes Maternal Grandmother    Heart disease Maternal Grandfather    Stroke Paternal Grandmother    Cancer Sister    Social History   Socioeconomic History   Marital status: Divorced    Spouse name: Not on file   Number of children: Not on file   Years of education: Not on file   Highest education level: 12th grade  Occupational History   Not on file  Tobacco Use   Smoking status: Never   Smokeless tobacco: Never  Substance and Sexual Activity   Alcohol use: No   Drug use: No   Sexual activity: Yes    Birth control/protection: Surgical  Other Topics Concern   Not on file  Social History Narrative   Not on file   Social Drivers of Health   Financial Resource Strain: Low Risk  (09/17/2023)   Overall Financial Resource Strain (CARDIA)    Difficulty of Paying Living Expenses: Not hard at all  Food Insecurity: No Food Insecurity (09/17/2023)   Hunger Vital Sign    Worried About Running Out of Food in the Last Year: Never true    Ran Out of Food in the Last Year: Never true  Transportation Needs: No Transportation Needs (09/17/2023)   PRAPARE - Administrator, Civil Service (Medical): No    Lack of Transportation (Non-Medical): No  Physical Activity: Insufficiently Active (09/17/2023)   Exercise Vital Sign    Days of Exercise per Week: 2 days    Minutes of Exercise per Session: 30 min  Stress: No Stress Concern Present (09/17/2023)   [employer] of Occupational Health - Occupational Stress Questionnaire    Feeling of Stress: Not at all  Social Connections: Moderately Integrated (09/17/2023)   Social Connection and Isolation Panel    Frequency of Communication with Friends and Family: More than three times a week    Frequency of Social Gatherings with  Friends and Family: Three times a week    Attends Religious Services: More than 4 times per year    Active Member of Clubs or Organizations: Yes    Attends Banker Meetings: 1 to 4 times per year    Marital Status: Divorced    Tobacco Counseling Counseling given: Not Answered    Clinical Intake:  Pre-visit preparation completed: Yes  Pain : No/denies pain     BMI - recorded: 31.6 Nutritional Status: BMI > 30  Obese Nutritional Risks: None Diabetes: No  Lab Results  Component Value Date   HGBA1C 5.8 12/11/2022   HGBA1C 5.8 12/06/2021   HGBA1C 5.9 12/03/2020     How often do you need to have someone help you when you read instructions, pamphlets, or other written materials from your  doctor or pharmacy?: 1 - Never  Interpreter Needed?: No  Information entered by :: R. Sameerah Nachtigal LPN   Activities of Daily Living     09/17/2023    9:50 AM  In your present state of health, do you have any difficulty performing the following activities:  Hearing? 0  Vision? 0  Comment glasses  Difficulty concentrating or making decisions? 0  Walking or climbing stairs? 0  Dressing or bathing? 0  Doing errands, shopping? 0  Preparing Food and eating ? N  Using the Toilet? N  In the past six months, have you accidently leaked urine? N  Do you have problems with loss of bowel control? N  Managing your Medications? N  Managing your Finances? N  Housekeeping or managing your Housekeeping? N    Patient Care Team: Marylynn Verneita CROME, MD as PCP - General (Internal Medicine) Theotis Lavelle BRAVO, MD as Referring Physician (Specialist) Croley, Granville M, PA-C (Gastroenterology)  I have updated your Care Teams any recent Medical Services you may have received from other providers in the past year.     Assessment:   This is a routine wellness examination for Donna Richards.  Hearing/Vision screen Hearing Screening - Comments:: No issues Vision Screening - Comments:: glasses   Goals  Addressed             This Visit's Progress    Patient Stated       Wants to continue to lose at least 10 more pounds and exercise moe       Depression Screen     09/19/2023    1:51 PM 09/18/2022    2:29 PM 09/18/2022    2:28 PM 09/18/2022    2:27 PM 12/06/2021    1:35 PM 08/11/2021    2:53 PM 12/03/2020    8:15 AM  PHQ 2/9 Scores  PHQ - 2 Score 0 0 0 0 0 0 0  PHQ- 9 Score 0 0 0 0     Exception Documentation   Patient refusal        Fall Risk     09/17/2023    9:50 AM 09/18/2022    2:36 PM 09/18/2022    8:16 AM 09/14/2022    1:20 PM 12/06/2021    1:35 PM  Fall Risk   Falls in the past year? 0 0 0 0 0  Number falls in past yr: 0 0   0  Injury with Fall? 0 0   0  Risk for fall due to : No Fall Risks No Fall Risks   No Fall Risks  Follow up Falls evaluation completed;Falls prevention discussed Falls prevention discussed;Falls evaluation completed;Education provided   Falls evaluation completed      Data saved with a previous flowsheet row definition    MEDICARE RISK AT HOME:  Medicare Risk at Home Any stairs in or around the home?: (Patient-Rptd) Yes If so, are there any without handrails?: No Home free of loose throw rugs in walkways, pet beds, electrical cords, etc?: (Patient-Rptd) Yes Adequate lighting in your home to reduce risk of falls?: (Patient-Rptd) Yes Life alert?: (Patient-Rptd) No Use of a cane, walker or w/c?: (Patient-Rptd) No Grab bars in the bathroom?: (Patient-Rptd) No Shower chair or bench in shower?: (Patient-Rptd) No Elevated toilet seat or a handicapped toilet?: (Patient-Rptd) No  TIMED UP AND GO:  Was the test performed?  No  Cognitive Function: 6CIT completed        09/19/2023    1:57 PM 09/18/2022  2:33 PM  6CIT Screen  What Year? 0 points 0 points  What month? 0 points 0 points  What time? 0 points 0 points  Count back from 20 0 points 0 points  Months in reverse 0 points 0 points  Repeat phrase 0 points 2 points  Total Score 0 points 2  points    Immunizations Immunization History  Administered Date(s) Administered   Influenza Inj Mdck Quad Pf 01/13/2019   Influenza, Quadrivalent, Recombinant, Inj, Pf 01/02/2017, 01/25/2018   PNEUMOCOCCAL CONJUGATE-20 12/11/2022   Pneumococcal Conjugate-13 08/23/2016   Tdap 07/26/2012   Zoster, Live 07/28/2013    Screening Tests Health Maintenance  Topic Date Due   Zoster Vaccines- Shingrix (1 of 2) 08/11/1970   DTaP/Tdap/Td (2 - Td or Tdap) 07/27/2022   Medicare Annual Wellness (AWV)  09/18/2023   INFLUENZA VACCINE  10/19/2023   MAMMOGRAM  06/21/2024   Colonoscopy  03/21/2028   Pneumococcal Vaccine: 50+ Years  Completed   DEXA SCAN  Completed   Hepatitis C Screening  Completed   Hepatitis B Vaccines  Aged Out   HPV VACCINES  Aged Out   Meningococcal B Vaccine  Aged Out   COVID-19 Vaccine  Discontinued    Health Maintenance  Health Maintenance Due  Topic Date Due   Zoster Vaccines- Shingrix (1 of 2) 08/11/1970   DTaP/Tdap/Td (2 - Td or Tdap) 07/27/2022   Medicare Annual Wellness (AWV)  09/18/2023   Health Maintenance Items Addressed: Discussed the need to update shingles and tetanus vaccines. Patient declines flu and covi vaccines  Additional Screening:  Vision Screening: Recommended annual ophthalmology exams for early detection of glaucoma and other disorders of the eye. Up to date  Dr. Carolee Would you like a referral to an eye doctor? No    Dental Screening: Recommended annual dental exams for proper oral hygiene  Community Resource Referral / Chronic Care Management: CRR required this visit?  No   CCM required this visit?  No   Plan:    I have personally reviewed and noted the following in the patient's chart:   Medical and social history Use of alcohol, tobacco or illicit drugs  Current medications and supplements including opioid prescriptions. Patient is not currently taking opioid prescriptions. Functional ability and status Nutritional  status Physical activity Advanced directives List of other physicians Hospitalizations, surgeries, and ER visits in previous 12 months Vitals Screenings to include cognitive, depression, and falls Referrals and appointments  In addition, I have reviewed and discussed with patient certain preventive protocols, quality metrics, and best practice recommendations. A written personalized care plan for preventive services as well as general preventive health recommendations were provided to patient.   Angeline Fredericks, LPN   04/26/7972   After Visit Summary: (MyChart) Due to this being a telephonic visit, the after visit summary with patients personalized plan was offered to patient via MyChart   Notes: Nothing significant to report at this time.

## 2023-09-19 NOTE — Patient Instructions (Signed)
 Donna Richards , Thank you for taking time out of your busy schedule to complete your Annual Wellness Visit with me. I enjoyed our conversation and look forward to speaking with you again next year. I, as well as your care team,  appreciate your ongoing commitment to your health goals. Please review the following plan we discussed and let me know if I can assist you in the future. Your Game plan/ To Do List    Referrals: If you haven't heard from the office you've been referred to, please reach out to them at the phone provided.  Consider updating your shingles and tetanus vaccines at your pharmacy. Follow up Visits: Next Medicare AWV with our clinical staff: 09/24/24 @ 1:40   Have you seen your provider in the last 6 months (3 months if uncontrolled diabetes)? Yes Next Office Visit with your provider: nurse visit scheduled 10/17/23 will schedule CPE when she comes in   Clinician Recommendations:  Aim for 30 minutes of exercise or brisk walking, 6-8 glasses of water, and 5 servings of fruits and vegetables each day.       This is a list of the screening recommended for you and due dates:  Health Maintenance  Topic Date Due   Zoster (Shingles) Vaccine (1 of 2) 08/11/1970   DTaP/Tdap/Td vaccine (2 - Td or Tdap) 07/27/2022   Flu Shot  10/19/2023   Mammogram  06/21/2024   Medicare Annual Wellness Visit  09/18/2024   Colon Cancer Screening  03/21/2028   Pneumococcal Vaccine for age over 57  Completed   DEXA scan (bone density measurement)  Completed   Hepatitis C Screening  Completed   Hepatitis B Vaccine  Aged Out   HPV Vaccine  Aged Out   Meningitis B Vaccine  Aged Out   COVID-19 Vaccine  Discontinued    Advanced directives: (ACP Link)Information on Advanced Care Planning can be found at [place]  Secretary of [facility] Advance Health Care Directives Advance Health Care Directives. [email or web address]  Advance Care Planning is important because it:  [x]  Makes sure you receive the medical care that is  consistent with your values, goals, and preferences  [x]  It provides guidance to your family and loved ones and reduces their decisional burden about whether or not they are making the right decisions based on your wishes.

## 2023-10-17 ENCOUNTER — Ambulatory Visit

## 2023-10-17 DIAGNOSIS — E538 Deficiency of other specified B group vitamins: Secondary | ICD-10-CM | POA: Diagnosis not present

## 2023-10-17 MED ORDER — CYANOCOBALAMIN 1000 MCG/ML IJ SOLN
1000.0000 ug | Freq: Once | INTRAMUSCULAR | Status: AC
  Administered 2023-10-17: 1000 ug via INTRAMUSCULAR

## 2023-10-17 NOTE — Progress Notes (Signed)
 Patient is in office today for a nurse visit for B12 Injection. Patient Injection was given in the  Right deltoid. Patient tolerated injection well.

## 2023-10-18 ENCOUNTER — Other Ambulatory Visit: Payer: Self-pay

## 2023-10-18 ENCOUNTER — Other Ambulatory Visit: Payer: Self-pay | Admitting: Internal Medicine

## 2023-10-18 MED ORDER — PANTOPRAZOLE SODIUM 40 MG PO TBEC: DELAYED_RELEASE_TABLET | ORAL | 1 refills | Status: DC

## 2023-10-18 NOTE — Telephone Encounter (Signed)
 Copied from CRM [id number]. Topic: General - Other >> Oct 18, 2023  8:54 AM Revonda D wrote: Reason for CRM: Pt received a message in MyChart to schedule an appt before she can get her medication refilled. Pt scheduled appt for 9/22 since that was the only availability but stated she couldn't wait that long for the medications to be refilled. Pt would like to know if she could get the medication refilled now since the appt has been scheduled. Pt would like a callback with an update.

## 2023-10-23 DIAGNOSIS — J4520 Mild intermittent asthma, uncomplicated: Secondary | ICD-10-CM | POA: Diagnosis not present

## 2023-10-23 DIAGNOSIS — G4733 Obstructive sleep apnea (adult) (pediatric): Secondary | ICD-10-CM | POA: Diagnosis not present

## 2023-10-30 DIAGNOSIS — J4520 Mild intermittent asthma, uncomplicated: Secondary | ICD-10-CM | POA: Diagnosis not present

## 2023-10-30 DIAGNOSIS — G4733 Obstructive sleep apnea (adult) (pediatric): Secondary | ICD-10-CM | POA: Diagnosis not present

## 2023-10-30 DIAGNOSIS — J301 Allergic rhinitis due to pollen: Secondary | ICD-10-CM | POA: Diagnosis not present

## 2023-11-15 ENCOUNTER — Ambulatory Visit (INDEPENDENT_AMBULATORY_CARE_PROVIDER_SITE_OTHER)

## 2023-11-15 DIAGNOSIS — E538 Deficiency of other specified B group vitamins: Secondary | ICD-10-CM | POA: Diagnosis not present

## 2023-11-15 MED ORDER — CYANOCOBALAMIN 1000 MCG/ML IJ SOLN
1000.0000 ug | Freq: Once | INTRAMUSCULAR | Status: AC
  Administered 2023-11-15: 1000 ug via INTRAMUSCULAR

## 2023-11-15 NOTE — Progress Notes (Signed)
 Patient is in office today for a nurse visit for B12 Injection. Patient Injection was given in the  Left deltoid. Patient tolerated injection well.

## 2023-12-10 ENCOUNTER — Encounter: Payer: Self-pay | Admitting: Internal Medicine

## 2023-12-10 ENCOUNTER — Ambulatory Visit (INDEPENDENT_AMBULATORY_CARE_PROVIDER_SITE_OTHER): Admitting: Internal Medicine

## 2023-12-10 VITALS — BP 108/74 | HR 73 | Ht 64.5 in | Wt 187.8 lb

## 2023-12-10 DIAGNOSIS — Z0000 Encounter for general adult medical examination without abnormal findings: Secondary | ICD-10-CM | POA: Diagnosis not present

## 2023-12-10 DIAGNOSIS — E559 Vitamin D deficiency, unspecified: Secondary | ICD-10-CM | POA: Diagnosis not present

## 2023-12-10 DIAGNOSIS — R1011 Right upper quadrant pain: Secondary | ICD-10-CM | POA: Diagnosis not present

## 2023-12-10 DIAGNOSIS — N289 Disorder of kidney and ureter, unspecified: Secondary | ICD-10-CM

## 2023-12-10 DIAGNOSIS — J4520 Mild intermittent asthma, uncomplicated: Secondary | ICD-10-CM

## 2023-12-10 DIAGNOSIS — G4733 Obstructive sleep apnea (adult) (pediatric): Secondary | ICD-10-CM

## 2023-12-10 DIAGNOSIS — R5383 Other fatigue: Secondary | ICD-10-CM

## 2023-12-10 DIAGNOSIS — I10 Essential (primary) hypertension: Secondary | ICD-10-CM | POA: Diagnosis not present

## 2023-12-10 DIAGNOSIS — E785 Hyperlipidemia, unspecified: Secondary | ICD-10-CM

## 2023-12-10 DIAGNOSIS — E538 Deficiency of other specified B group vitamins: Secondary | ICD-10-CM

## 2023-12-10 DIAGNOSIS — R7303 Prediabetes: Secondary | ICD-10-CM | POA: Diagnosis not present

## 2023-12-10 MED ORDER — TETANUS-DIPHTH-ACELL PERTUSSIS 5-2.5-18.5 LF-MCG/0.5 IM SUSY: 0.5000 mL | PREFILLED_SYRINGE | Freq: Once | INTRAMUSCULAR | 0 refills | Status: AC

## 2023-12-10 MED ORDER — ROSUVASTATIN CALCIUM 5 MG PO TABS: ORAL_TABLET | ORAL | 1 refills | Status: AC

## 2023-12-10 MED ORDER — HYDROCHLOROTHIAZIDE 25 MG PO TABS: ORAL_TABLET | ORAL | 1 refills | Status: DC

## 2023-12-10 NOTE — Assessment & Plan Note (Signed)
She prefers to Continue monthly IM injections in office

## 2023-12-10 NOTE — Progress Notes (Unsigned)
 Patient ID: Donna Richards, female    DOB: 26-Dec-1950  Age: 73 y.o. MRN: [id number]  The patient is here for annual preventive  examination and management of other chronic and acute problems.   The risk factors are reflected in the social history.  The roster of all physicians providing medical care to patient - is listed in the Snapshot section of the chart.  Activities of daily living:  The patient is 100% independent in all ADLs: dressing, toileting, feeding as well as independent mobility  Home safety : The patient has smoke detectors in the home. They wear seatbelts.  There are no firearms at home. There is no violence in the home.   There is no risks for hepatitis, STDs or HIV. There is no   history of blood transfusion. They have no travel history to infectious disease endemic areas of the world.  The patient has seen their dentist in the last six month. They have seen their eye doctor in the last year. They admit to slight hearing difficulty with regard to whispered voices and some television programs.  They have deferred audiologic testing in the last year.  They do not  have excessive sun exposure. Discussed the need for sun protection: hats, long sleeves and use of sunscreen if there is significant sun exposure.   Diet: the importance of a healthy diet is discussed. They do have a healthy diet.  The benefits of regular aerobic exercise were discussed. She walks 4 times per week ,  20 minutes.   Depression screen: there are no signs or vegative symptoms of depression- irritability, change in appetite, anhedonia, sadness/tearfullness.  Cognitive assessment: the patient manages all their financial and personal affairs and is actively engaged. They could relate day,date,year and events; recalled 2/3 objects at 3 minutes; performed clock-face test normally.  The following portions of the patient's history were reviewed and updated as appropriate: allergies, current medications, past  family history, past medical history,  past surgical history, past social history  and problem list.  Visual acuity was not assessed per patient preference since she has regular follow up with her ophthalmologist. Hearing and body mass index were assessed and reviewed.   During the course of the visit the patient was educated and counseled about appropriate screening and preventive services including : fall prevention , diabetes screening, nutrition counseling, colorectal cancer screening, and recommended immunizations.    CC: The primary encounter diagnosis was Visit for preventive health examination. Diagnoses of Primary hypertension, Hyperlipidemia with target LDL less than 100, Prediabetes, B12 deficiency, Other fatigue, Mild intermittent asthma without complication, OSA (obstructive sleep apnea), Vitamin D  deficiency, Colicky RUQ abdominal pain, and Renal insufficiency, mild were also pertinent to this visit.  B12 deficiency'  Taking vitamin D  5000 Ius every other   History Donna Richards has a past medical history of Allergy (1986), Arthritis, Asthma, Asthma, chronic (06/09/2014), GERD (gastroesophageal reflux disease), Hyperlipidemia, Inflammatory polyps of colon (HCC), Migraines, Stroke (HCC) (1992), and UTI (urinary tract infection).   She has a past surgical history that includes Bilateral oophorectomy and Abdominal hysterectomy (1996).   Her family history includes COPD in her father and mother; Cancer in her sister; Diabetes in her maternal grandmother and mother; Heart disease in her father, maternal grandfather, and mother; Hyperlipidemia in her mother; Kidney disease in her mother; Stroke in her paternal grandmother.She reports that she has never smoked. She has never used smokeless tobacco. She reports that she does not drink alcohol and does not use drugs.  Outpatient Medications Prior to Visit  Medication Sig Dispense Refill   albuterol  (PROVENTIL  HFA;VENTOLIN  HFA) 108 (90 Base)  MCG/ACT inhaler Inhale into the lungs.     ARNUITY ELLIPTA 100 MCG/ACT AEPB Inhale into the lungs.  Inhale 1 Puff (1 spray total) into the lungs once daily     aspirin EC 81 MG tablet Take 81 mg by mouth every other day.     cetirizine (ZYRTEC) 10 MG tablet Take 10 mg by mouth daily.     cholecalciferol (VITAMIN D3) 25 MCG (1000 UNIT) tablet Take 5 tablets (5,000 Units total) by mouth daily.     cyanocobalamin  (VITAMIN B12) 1000 MCG/ML injection Inject 1,000 mcg into the muscle every 30 (thirty) days.     fexofenadine  (ALLEGRA ) 180 MG tablet Take 180 mg by mouth daily.     fluticasone  (FLONASE ) 50 MCG/ACT nasal spray instill 1 spray into each nostril twice a day 16 g 5   pantoprazole  (PROTONIX ) 40 MG tablet TAKE 1 TABLET(40 MG) BY MOUTH DAILY 90 tablet 1   RA ALLERGY RELIEF 180 MG tablet take 1 tablet by mouth once daily 30 tablet 11   hydrochlorothiazide  (HYDRODIURIL ) 25 MG tablet TAKE 1 TABLET(25 MG) BY MOUTH DAILY 90 tablet 0   rosuvastatin  (CRESTOR ) 5 MG tablet TAKE 1 TABLET(5 MG) BY MOUTH DAILY 90 tablet 0   levocetirizine (XYZAL) 5 MG tablet Take 5 mg by mouth every evening. (Patient not taking: Reported on 12/10/2023)     Na Sulfate-K Sulfate-Mg Sulf 17.5-3.13-1.6 GM/177ML SOLN Take by mouth. Take 1 Bottle by mouth as directed One kit contains 2 bottles. Take both bottles at the times instructed by your provider. (Patient not taking: Reported on 09/19/2023)     No facility-administered medications prior to visit.    Review of Systems  Patient denies headache, fevers, malaise, unintentional weight loss, skin rash, eye pain, sinus congestion and sinus pain, sore throat, dysphagia,  hemoptysis , cough, dyspnea, wheezing, chest pain, palpitations, orthopnea, edema, abdominal pain, nausea, melena, diarrhea, constipation, flank pain, dysuria, hematuria, urinary  Frequency, nocturia, numbness, tingling, seizures,  Focal weakness, Loss of consciousness,  Tremor, insomnia, depression, anxiety, and  suicidal ideation.     Objective:  BP 108/74   Pulse 73   Ht 5' 4.5 (1.638 m)   Wt 187 lb 12.8 oz (85.2 kg)   SpO2 97%   BMI 31.74 kg/m   Physical Exam Vitals reviewed.  Constitutional:      General: She is not in acute distress.    Appearance: Normal appearance. She is well-developed, well-groomed and overweight. She is not ill-appearing, toxic-appearing or diaphoretic.  HENT:     Head: Normocephalic.     Right Ear: Tympanic membrane, ear canal and external ear normal. There is no impacted cerumen.     Left Ear: Tympanic membrane, ear canal and external ear normal. There is no impacted cerumen.     Nose: Nose normal.     Mouth/Throat:     Mouth: Mucous membranes are moist.     Pharynx: Oropharynx is clear.  Eyes:     General: No scleral icterus.       Right eye: No discharge.        Left eye: No discharge.     Conjunctiva/sclera: Conjunctivae normal.     Pupils: Pupils are equal, round, and reactive to light.  Neck:     Thyroid : No thyromegaly.     Vascular: No carotid bruit or JVD.  Cardiovascular:     Rate  and Rhythm: Normal rate and regular rhythm.     Heart sounds: Normal heart sounds.  Pulmonary:     Effort: Pulmonary effort is normal. No respiratory distress.     Breath sounds: Normal breath sounds.  Chest:  Breasts:    Breasts are symmetrical.     Right: Normal. No swelling, inverted nipple, mass, nipple discharge, skin change or tenderness.     Left: Normal. No swelling, inverted nipple, mass, nipple discharge, skin change or tenderness.  Abdominal:     General: Bowel sounds are normal.     Palpations: Abdomen is soft. There is no mass.     Tenderness: There is no abdominal tenderness. There is no guarding or rebound.  Musculoskeletal:        General: Normal range of motion.     Cervical back: Normal range of motion and neck supple.  Lymphadenopathy:     Cervical: No cervical adenopathy.     Upper Body:     Right upper body: No supraclavicular,  axillary or pectoral adenopathy.     Left upper body: No supraclavicular, axillary or pectoral adenopathy.  Skin:    General: Skin is warm and dry.  Neurological:     General: No focal deficit present.     Mental Status: She is alert and oriented to person, place, and time. Mental status is at baseline.  Psychiatric:        Mood and Affect: Mood normal.        Behavior: Behavior normal.        Thought Content: Thought content normal.        Judgment: Judgment normal.      Assessment & Plan:  Visit for preventive health examination Assessment & Plan: age appropriate education and counseling updated, referrals for preventative services and immunizations addressed, dietary and smoking counseling addressed, most recent labs reviewed.  I have personally reviewed and have noted:   1) the patient's medical and social history 2) The pt's use of alcohol, tobacco, and illicit drugs 3) The patient's current medications and supplements 4) Functional ability including ADL's, fall risk, home safety risk, hearing and visual impairment 5) Diet and physical activities 6) Evidence for depression or mood disorder 7) The patient's height, weight, and BMI have been recorded in the chart    I have made referrals, and provided counseling and education based on review of the above    Primary hypertension Assessment & Plan: Well controlled on current regimen of hctz  Renal function  is normal.   Lab Results  Component Value Date   CREATININE 0.96 12/11/2022   . Lab Results  Component Value Date   NA 141 12/11/2022   K 3.5 12/11/2022   CL 100 12/11/2022   CO2 33 (H) 12/11/2022     Orders: -     Comprehensive metabolic panel with GFR -     Microalbumin / creatinine urine ratio  Hyperlipidemia with target LDL less than 100 Assessment & Plan: Lipids have been at goal on every other day dosing of crestor .  Labs are overdue  Lab Results  Component Value Date   CHOL 164 12/11/2022   HDL  75.50 12/11/2022   LDLCALC 72 12/11/2022   LDLDIRECT 78.0 12/11/2022   TRIG 82.0 12/11/2022   CHOLHDL 2 12/11/2022   Lab Results  Component Value Date   ALT 13 12/11/2022   AST 17 12/11/2022   ALKPHOS 34 (L) 12/11/2022   BILITOT 1.2 12/11/2022     Orders: -  Lipid panel -     LDL cholesterol, direct  Prediabetes Assessment & Plan:  She has been losing weight gradually by following a glycemic index diet  but not yet  participating regularly in an aerobic  exercise activity.  Repeat labs are pending  Lab Results  Component Value Date   HGBA1C 5.8 12/11/2022      Orders: -     Hemoglobin A1c  B12 deficiency Assessment & Plan: She prefers to Continue monthly IM injections in office   Orders: -     CBC with Differential/Platelet  Other fatigue -     TSH  Mild intermittent asthma without complication Assessment & Plan: Managed with Arnuity  PFTs were repeated in August 2025 noting early obstructive patter, hyperinflation    OSA (obstructive sleep apnea) Assessment & Plan: Untreated until  May 2023 ,now on CPAP e.     Vitamin D  deficiency Assessment & Plan: Last 2 readings have been higher end of normal and she is taking large amounts.  Repeat level needed   Orders: -     VITAMIN D  25 Hydroxy (Vit-D Deficiency, Fractures)  Colicky RUQ abdominal pain Assessment & Plan: Reviewed prior workup in 2022.  Recent episode of distension was  described was transient and highly suggestive of intraluminal gas.  Diet reviewed,  advised to add Beano   Renal insufficiency, mild Assessment & Plan: Ay be due to fasting state.  Will return for nonfasting labs   Lab Results  Component Value Date   CREATININE 0.96 12/11/2022   Lab Results  Component Value Date   NA 141 12/11/2022   K 3.5 12/11/2022   CL 100 12/11/2022   CO2 33 (H) 12/11/2022      Other orders -     hydroCHLOROthiazide ; TAKE 1 TABLET(25 MG) BY MOUTH DAILY  Dispense: 90 tablet; Refill: 1 -      Rosuvastatin  Calcium ; TAKE 1 TABLET(5 MG) BY MOUTH DAILY  Dispense: 90 tablet; Refill: 1 -     Tetanus-Diphth-Acell Pertussis; Inject 0.5 mLs into the muscle once for 1 dose.  Dispense: 0.5 mL; Refill: 0      I provided 40 minutes of  face-to-face time during this encounter reviewing patient's current problems and past surgeries,  recent labs and imaging studies, providing counseling on the above mentioned problems , and coordination  of care .   Follow-up: Return in about 6 months (around 06/08/2024) for hypertension.   Verneita LITTIE Kettering, MD

## 2023-12-10 NOTE — Assessment & Plan Note (Addendum)
 Untreated until  May 2023 ,now on CPAP e.

## 2023-12-10 NOTE — Assessment & Plan Note (Signed)
 Managed with Arnuity  PFTs were repeated in August 2025 noting early obstructive patter, hyperinflation

## 2023-12-10 NOTE — Assessment & Plan Note (Signed)

## 2023-12-10 NOTE — Patient Instructions (Signed)
   Try using beano before any meal with any beans , cabbage,  Broccoli, cauliflower and brussel sprouts

## 2023-12-11 LAB — CBC WITH DIFFERENTIAL/PLATELET
Basophils Absolute: 0 K/uL (ref 0.0–0.1)
Basophils Relative: 0.8 % (ref 0.0–3.0)
Eosinophils Absolute: 0.1 K/uL (ref 0.0–0.7)
Eosinophils Relative: 1.5 % (ref 0.0–5.0)
HCT: 42.3 % (ref 36.0–46.0)
Hemoglobin: 13.9 g/dL (ref 12.0–15.0)
Lymphocytes Relative: 19.7 % (ref 12.0–46.0)
Lymphs Abs: 1 K/uL (ref 0.7–4.0)
MCHC: 32.9 g/dL (ref 30.0–36.0)
MCV: 88.6 fl (ref 78.0–100.0)
Monocytes Absolute: 0.5 K/uL (ref 0.1–1.0)
Monocytes Relative: 10.1 % (ref 3.0–12.0)
Neutro Abs: 3.5 K/uL (ref 1.4–7.7)
Neutrophils Relative %: 67.9 % (ref 43.0–77.0)
Platelets: 211 K/uL (ref 150.0–400.0)
RBC: 4.77 Mil/uL (ref 3.87–5.11)
RDW: 12.8 % (ref 11.5–15.5)
WBC: 5.1 K/uL (ref 4.0–10.5)

## 2023-12-11 LAB — COMPREHENSIVE METABOLIC PANEL WITH GFR
ALT: 14 U/L (ref 0–35)
AST: 18 U/L (ref 0–37)
Albumin: 4.4 g/dL (ref 3.5–5.2)
Alkaline Phosphatase: 35 U/L — ABNORMAL LOW (ref 39–117)
BUN: 15 mg/dL (ref 6–23)
CO2: 32 meq/L (ref 19–32)
Calcium: 10 mg/dL (ref 8.4–10.5)
Chloride: 99 meq/L (ref 96–112)
Creatinine, Ser: 0.98 mg/dL (ref 0.40–1.20)
GFR: 57.74 mL/min — ABNORMAL LOW (ref >60.00)
Glucose, Bld: 90 mg/dL (ref 70–99)
Potassium: 3.6 meq/L (ref 3.5–5.1)
Sodium: 140 meq/L (ref 135–145)
Total Bilirubin: 1.4 mg/dL — ABNORMAL HIGH (ref 0.2–1.2)
Total Protein: 6.6 g/dL (ref 6.0–8.3)

## 2023-12-11 LAB — MICROALBUMIN / CREATININE URINE RATIO
Creatinine,U: 127.8 mg/dL
Microalb Creat Ratio: UNDETERMINED mg/g (ref 0.0–30.0)
Microalb, Ur: <0.7 mg/dL

## 2023-12-11 LAB — LIPID PANEL
Cholesterol: 154 mg/dL (ref 0–200)
HDL: 70.5 mg/dL (ref >39.00)
LDL Cholesterol: 69 mg/dL (ref 0–99)
NonHDL: 83.43
Total CHOL/HDL Ratio: 2
Triglycerides: 71 mg/dL (ref 0.0–149.0)
VLDL: 14.2 mg/dL (ref 0.0–40.0)

## 2023-12-11 LAB — HEMOGLOBIN A1C: Hgb A1c MFr Bld: 5.9 % (ref 4.6–6.5)

## 2023-12-11 LAB — LDL CHOLESTEROL, DIRECT: Direct LDL: 64 mg/dL

## 2023-12-11 LAB — VITAMIN D 25 HYDROXY (VIT D DEFICIENCY, FRACTURES): VITD: 56.22 ng/mL (ref 30.00–100.00)

## 2023-12-11 LAB — TSH: TSH: 0.98 u[IU]/mL (ref 0.35–5.50)

## 2023-12-11 NOTE — Assessment & Plan Note (Signed)
 Well controlled on current regimen of hctz  Renal function  is normal.   Lab Results  Component Value Date   CREATININE 0.96 12/11/2022   . Lab Results  Component Value Date   NA 141 12/11/2022   K 3.5 12/11/2022   CL 100 12/11/2022   CO2 33 (H) 12/11/2022

## 2023-12-11 NOTE — Assessment & Plan Note (Signed)
 Last 2 readings have been higher end of normal and she is taking large amounts.  Repeat level needed

## 2023-12-11 NOTE — Assessment & Plan Note (Signed)
 Lipids have been at goal on every other day dosing of crestor .  Labs are overdue  Lab Results  Component Value Date   CHOL 164 12/11/2022   HDL 75.50 12/11/2022   LDLCALC 72 12/11/2022   LDLDIRECT 78.0 12/11/2022   TRIG 82.0 12/11/2022   CHOLHDL 2 12/11/2022   Lab Results  Component Value Date   ALT 13 12/11/2022   AST 17 12/11/2022   ALKPHOS 34 (L) 12/11/2022   BILITOT 1.2 12/11/2022

## 2023-12-11 NOTE — Assessment & Plan Note (Signed)
 Ay be due to fasting state.  Will return for nonfasting labs   Lab Results  Component Value Date   CREATININE 0.96 12/11/2022   Lab Results  Component Value Date   NA 141 12/11/2022   K 3.5 12/11/2022   CL 100 12/11/2022   CO2 33 (H) 12/11/2022

## 2023-12-11 NOTE — Assessment & Plan Note (Signed)
 She has been losing weight gradually by following a glycemic index diet  but not yet  participating regularly in an aerobic  exercise activity.  Repeat labs are pending  Lab Results  Component Value Date   HGBA1C 5.8 12/11/2022

## 2023-12-11 NOTE — Assessment & Plan Note (Addendum)
 Reviewed prior workup in 2022.  Recent episode of distension was  described was transient and highly suggestive of intraluminal gas.  Diet reviewed,  advised to add Beano

## 2023-12-12 ENCOUNTER — Ambulatory Visit: Payer: Self-pay | Admitting: Internal Medicine

## 2023-12-17 ENCOUNTER — Ambulatory Visit (INDEPENDENT_AMBULATORY_CARE_PROVIDER_SITE_OTHER)

## 2023-12-17 ENCOUNTER — Ambulatory Visit

## 2023-12-17 DIAGNOSIS — E538 Deficiency of other specified B group vitamins: Secondary | ICD-10-CM

## 2023-12-17 MED ORDER — CYANOCOBALAMIN 1000 MCG/ML IJ SOLN
1000.0000 ug | Freq: Once | INTRAMUSCULAR | Status: AC
  Administered 2023-12-17: 1000 ug via INTRAMUSCULAR

## 2023-12-17 NOTE — Progress Notes (Signed)
 Pt presented for their vitamin B12 injection. Pt was identified through two identifiers. Pt tolerated shot well in their right deltoid.

## 2024-01-15 DIAGNOSIS — H25813 Combined forms of age-related cataract, bilateral: Secondary | ICD-10-CM | POA: Diagnosis not present

## 2024-01-16 ENCOUNTER — Ambulatory Visit

## 2024-01-17 ENCOUNTER — Ambulatory Visit (INDEPENDENT_AMBULATORY_CARE_PROVIDER_SITE_OTHER)

## 2024-01-17 DIAGNOSIS — E538 Deficiency of other specified B group vitamins: Secondary | ICD-10-CM | POA: Diagnosis not present

## 2024-01-17 MED ORDER — CYANOCOBALAMIN 1000 MCG/ML IJ SOLN
1000.0000 ug | Freq: Once | INTRAMUSCULAR | Status: AC
  Administered 2024-01-17: 1000 ug via INTRAMUSCULAR

## 2024-01-17 NOTE — Progress Notes (Signed)
 Patient presented for B 12 injection to left deltoid, patient voiced no concerns nor showed any signs of distress during injection.

## 2024-02-20 ENCOUNTER — Ambulatory Visit

## 2024-02-20 DIAGNOSIS — E538 Deficiency of other specified B group vitamins: Secondary | ICD-10-CM

## 2024-02-20 MED ORDER — CYANOCOBALAMIN 1000 MCG/ML IJ SOLN
1000.0000 ug | Freq: Once | INTRAMUSCULAR | Status: AC
  Administered 2024-02-20: 1000 ug via INTRAMUSCULAR

## 2024-02-20 NOTE — Progress Notes (Signed)
 Pt received B12 injection in Left  deltoid muscle. Pt tolerated it well with no complaints or concerns.

## 2024-03-24 ENCOUNTER — Ambulatory Visit (INDEPENDENT_AMBULATORY_CARE_PROVIDER_SITE_OTHER)

## 2024-03-24 DIAGNOSIS — E538 Deficiency of other specified B group vitamins: Secondary | ICD-10-CM | POA: Diagnosis not present

## 2024-03-24 MED ORDER — CYANOCOBALAMIN 1000 MCG/ML IJ SOLN
1000.0000 ug | Freq: Once | INTRAMUSCULAR | Status: AC
  Administered 2024-03-24: 1000 ug via INTRAMUSCULAR

## 2024-03-24 NOTE — Progress Notes (Signed)
 Pt presented for their vitamin B12 injection. Pt was identified through two identifiers. Pt tolerated shot well in their right deltoid.

## 2024-04-14 ENCOUNTER — Other Ambulatory Visit: Payer: Self-pay | Admitting: Internal Medicine

## 2024-04-23 ENCOUNTER — Other Ambulatory Visit: Payer: Self-pay | Admitting: Internal Medicine

## 2024-04-24 ENCOUNTER — Ambulatory Visit

## 2024-04-24 DIAGNOSIS — E538 Deficiency of other specified B group vitamins: Secondary | ICD-10-CM

## 2024-04-24 MED ORDER — CYANOCOBALAMIN 1000 MCG/ML IJ SOLN
1000.0000 ug | Freq: Once | INTRAMUSCULAR | Status: AC
  Administered 2024-04-24: 1000 ug via INTRAMUSCULAR

## 2024-04-24 NOTE — Progress Notes (Signed)
 Patient presented for B 12 injection to left deltoid, patient voiced no concerns nor showed any signs of distress during injection.

## 2024-05-22 ENCOUNTER — Ambulatory Visit

## 2024-06-10 ENCOUNTER — Ambulatory Visit: Admitting: Internal Medicine

## 2024-09-24 ENCOUNTER — Ambulatory Visit
